# Patient Record
Sex: Female | Born: 1980 | Race: White | Hispanic: No | Marital: Married | Smoking: Never smoker
Health system: Southern US, Community
[De-identification: ages and names within clinical notes are randomized; demographics above are authoritative.]

## PROBLEM LIST (undated history)

## (undated) DIAGNOSIS — F5104 Psychophysiologic insomnia: Secondary | ICD-10-CM

## (undated) DIAGNOSIS — L309 Dermatitis, unspecified: Secondary | ICD-10-CM

## (undated) DIAGNOSIS — Z789 Other specified health status: Secondary | ICD-10-CM

## (undated) DIAGNOSIS — M25551 Pain in right hip: Secondary | ICD-10-CM

## (undated) DIAGNOSIS — F419 Anxiety disorder, unspecified: Secondary | ICD-10-CM

## (undated) HISTORY — PX: EYE SURGERY: SHX253

## (undated) HISTORY — DX: Other specified health status: Z78.9

## (undated) HISTORY — DX: Dermatitis, unspecified: L30.9

## (undated) HISTORY — DX: Psychophysiologic insomnia: F51.04

## (undated) HISTORY — PX: WISDOM TOOTH EXTRACTION: SHX21

## (undated) HISTORY — DX: Pain in right hip: M25.551

## (undated) HISTORY — DX: Anxiety disorder, unspecified: F41.9

---

## 2005-03-13 ENCOUNTER — Ambulatory Visit: Payer: Self-pay | Admitting: Obstetrics and Gynecology

## 2005-04-07 ENCOUNTER — Ambulatory Visit: Payer: Self-pay | Admitting: Obstetrics and Gynecology

## 2005-04-08 ENCOUNTER — Ambulatory Visit: Payer: Self-pay | Admitting: Obstetrics and Gynecology

## 2005-07-08 ENCOUNTER — Ambulatory Visit: Payer: Self-pay | Admitting: Obstetrics and Gynecology

## 2005-08-10 ENCOUNTER — Observation Stay: Payer: Self-pay | Admitting: Obstetrics and Gynecology

## 2005-08-13 ENCOUNTER — Inpatient Hospital Stay: Payer: Self-pay | Admitting: Obstetrics and Gynecology

## 2007-02-22 ENCOUNTER — Ambulatory Visit: Payer: Self-pay | Admitting: Obstetrics and Gynecology

## 2007-07-21 ENCOUNTER — Inpatient Hospital Stay: Payer: Self-pay | Admitting: Obstetrics and Gynecology

## 2008-02-09 ENCOUNTER — Ambulatory Visit: Payer: Self-pay | Admitting: Family Medicine

## 2009-08-19 ENCOUNTER — Encounter: Payer: Self-pay | Admitting: Orthopedic Surgery

## 2009-09-15 ENCOUNTER — Encounter: Payer: Self-pay | Admitting: Orthopedic Surgery

## 2009-10-10 ENCOUNTER — Ambulatory Visit: Payer: Self-pay | Admitting: Orthopedic Surgery

## 2009-10-16 ENCOUNTER — Encounter: Payer: Self-pay | Admitting: Orthopedic Surgery

## 2010-09-19 ENCOUNTER — Other Ambulatory Visit: Payer: Self-pay | Admitting: Family Medicine

## 2011-06-23 ENCOUNTER — Ambulatory Visit: Payer: Self-pay | Admitting: Obstetrics and Gynecology

## 2012-05-18 LAB — HM PAP SMEAR: HM Pap smear: NORMAL

## 2012-10-03 LAB — LIPID PANEL
CHOLESTEROL: 111 mg/dL (ref 0–200)
HDL: 44 mg/dL (ref 35–70)
LDL Cholesterol: 59 mg/dL
Triglycerides: 40 mg/dL (ref 40–160)

## 2014-08-08 ENCOUNTER — Ambulatory Visit: Payer: Self-pay | Admitting: Family Medicine

## 2014-08-21 ENCOUNTER — Other Ambulatory Visit (INDEPENDENT_AMBULATORY_CARE_PROVIDER_SITE_OTHER): Payer: 59

## 2014-08-21 ENCOUNTER — Encounter: Payer: Self-pay | Admitting: Family Medicine

## 2014-08-21 ENCOUNTER — Ambulatory Visit (INDEPENDENT_AMBULATORY_CARE_PROVIDER_SITE_OTHER): Payer: 59 | Admitting: Family Medicine

## 2014-08-21 VITALS — BP 110/78 | HR 79 | Ht 65.0 in | Wt 140.0 lb

## 2014-08-21 DIAGNOSIS — M25551 Pain in right hip: Secondary | ICD-10-CM

## 2014-08-21 DIAGNOSIS — M7601 Gluteal tendinitis, right hip: Secondary | ICD-10-CM | POA: Insufficient documentation

## 2014-08-21 NOTE — Patient Instructions (Addendum)
Good to see you.  Ice 20 minutes 2 times daily. Usually after activity and before bed. Exercises 3 times a week.  Try pennsaid topically up to 2 times daily.  Pinkie amount.  You are doing good overall just need to strengthen the glutes.  Wear good shoes always.  Consider Vitamin D 2000 IU daily See me again in3 weeks.

## 2014-08-21 NOTE — Assessment & Plan Note (Signed)
Patient's ultrasound finding is more consistent with a chronic gluteal tendinitis. Patient given home exercises, topical anti-inflammatories, and we discussed home exercises. Patient did work with the Hydrographic surveyor today that I think will be beneficial as well. Patient will try to make these changes and come back and see me again in 3 weeks. Patient did respond somewhat timid pronation as well. Also patient continues to have pain we'll consider x-rays to make sure that there is no impingement. I think this is low likelihood.

## 2014-08-21 NOTE — Progress Notes (Signed)
Pre visit review using our clinic review tool, if applicable. No additional management support is needed unless otherwise documented below in the visit note. 

## 2014-08-21 NOTE — Progress Notes (Signed)
[facility] [address] [place], [place] [address] Phone: [phone] Subjective:     CC:  Hip pain   [id number] Christine Ball is a 34 y.o. female coming in with complaint of hip pain. Patient has had right-sided pain for approximate 7 years. Patient notices it after she had her son. Patient does not remove or any true injury. Patient used to be an avid runner running proximally 5-6 times a week. Patient states though that unfortunately over the course last 4-5 year she was then and able to do it. Patient states that the pain on the lateral aspect of the hip seems to be getting worse. Patient states that it is waking her up at night. States that he can stop her from certain activities. Denies any radiation down the leg or any numbness or weakness. Patient rates the severity of pain as 5 out of 10 has not responded to over-the-counter medications. Patient is also been on Cymbalta with no significant improvement. Patient denies any history of back pain and denies any fever, chills, or any abnormal weight loss.     Past medical history, social, surgical and family history all reviewed in electronic medical record.   Review of Systems: No headache, visual changes, nausea, vomiting, diarrhea, constipation, dizziness, abdominal pain, skin rash, fevers, chills, night sweats, weight loss, swollen lymph nodes, body aches, joint swelling, muscle aches, chest pain, shortness of breath, mood changes.   Objective Blood pressure 110/78, pulse 79, height 5\' 5"  (1.651 m), weight 140 lb (63.504 kg), SpO2 99 %.  General: No apparent distress alert and oriented x3 mood and affect normal, dressed appropriately.  HEENT: Pupils equal, extraocular movements intact  Respiratory: Patient's speak in full sentences and does not appear short of breath  Cardiovascular: No lower extremity edema, non tender, no erythema  Skin: Warm dry intact with no signs of infection or rash on  extremities or on axial skeleton.  Abdomen: Soft nontender  Neuro: Cranial nerves II through XII are intact, neurovascularly intact in all extremities with 2+ DTRs and 2+ pulses.  Lymph: No lymphadenopathy of posterior or anterior cervical chain or axillae bilaterally.  Gait normal with good balance and coordination.  MSK:  Non tender with full range of motion and good stability and symmetric strength and tone of shoulders, elbows, wrist, , knee and ankles bilaterally.  Hip: Right ROM IR: 25 Deg, ER: 45 Deg, Flexion: 120 Deg, Extension: 100 Deg, Abduction: 45 Deg, Adduction: 45 Deg Strength IR: 5/5, ER: 5/5, Flexion: 5/5, Extension: 5/5, Abduction: 3/5 compared to 54-5 on the contralateral side., Adduction: 5/5 Pelvic alignment unremarkable to inspection and palpation. Standing hip rotation and gait without trendelenburg sign / unsteadiness. Greater trochanter with minimal tenderness but patient is very tender over the insertion of the gluteal area. without tenderness to palpation. I'll tenderness over the piriformis Mild positive Faber No SI joint tenderness and normal minimal SI movement. Lateral hip unremarkable.  MSK US performed of: Right hip This study was ordered, performed, and interpreted by Charlann Boxer D.O.  Hip: Trochanteric bursa without swelling or effusion. Acetabular labrum visualized and without tears, displacement, or effusion in joint. Femoral neck appears unremarkable without increased power doppler signal along Cortex. Patient though does have what appears to be significant thickening of the tendon of the gluteal medius tendon at the insertion over the greater trochanteric area. No significant tearing but increasing Doppler flow noted.  IMPRESSION:  Gluteal tendinitis chronic  Procedure note 60109; 15  minutes spent for Therapeutic exercises as stated in above notes.  This included exercises focusing on stretching, strengthening, with significant focus on eccentric  aspects.   Proper technique shown and discussed handout in great detail with ATC.  All questions were discussed and answered.     Impression and Recommendations:     This case required medical decision making of moderate complexity.

## 2014-09-13 ENCOUNTER — Encounter: Payer: Self-pay | Admitting: Family Medicine

## 2014-09-13 ENCOUNTER — Ambulatory Visit (INDEPENDENT_AMBULATORY_CARE_PROVIDER_SITE_OTHER): Payer: 59 | Admitting: Family Medicine

## 2014-09-13 ENCOUNTER — Ambulatory Visit (INDEPENDENT_AMBULATORY_CARE_PROVIDER_SITE_OTHER)
Admission: RE | Admit: 2014-09-13 | Discharge: 2014-09-13 | Disposition: A | Discharge status: Home / Self Care | Payer: 59 | Source: Ambulatory Visit | Attending: Family Medicine | Admitting: Family Medicine

## 2014-09-13 VITALS — BP 104/76 | HR 69 | Ht 65.0 in | Wt 140.0 lb

## 2014-09-13 DIAGNOSIS — M25551 Pain in right hip: Secondary | ICD-10-CM

## 2014-09-13 DIAGNOSIS — M7601 Gluteal tendinitis, right hip: Secondary | ICD-10-CM

## 2014-09-13 MED ORDER — NITROGLYCERIN 0.2 MG/HR TD PT24: MEDICATED_PATCH | TRANSDERMAL | Status: DC

## 2014-09-13 NOTE — Progress Notes (Signed)
Pre visit review using our clinic review tool, if applicable. No additional management support is needed unless otherwise documented below in the visit note. 

## 2014-09-13 NOTE — Progress Notes (Signed)
  [facility] [address] [place], [place] [address] Phone: [phone] Subjective:     CC:  Hip pain follow-up   [id number] Christine Ball is a 34 y.o. female coming in with complaint of hip pain. Patient has had right-sided pain for approximate 7 years. Patient had failed conservative therapy including formal physical therapy back in 2011 and 2014. Patient was found to have what appeared to be more of a chronic gluteal tendinitis. Patient given home exercises, icing protocol, topical anti-inflammatories and discussed over-the-counter natural supplementations. Patient states overall she has made very minimal improvement. Patient has not started running yet. Patient states that it is not affecting her daily activities may be as much but when she does try to increase her activity the pain is still there. Patient has tried given the over-the-counter natural supplementations and is doing the exercises the patient has not been doing the icing. Maybe not as much pain at night.    Past medical history, social, surgical and family history all reviewed in electronic medical record.   Review of Systems: No headache, visual changes, nausea, vomiting, diarrhea, constipation, dizziness, abdominal pain, skin rash, fevers, chills, night sweats, weight loss, swollen lymph nodes, body aches, joint swelling, muscle aches, chest pain, shortness of breath, mood changes.   Objective Blood pressure 104/76, pulse 69, height 5\' 5"  (1.651 m), weight 140 lb (63.504 kg), last menstrual period 08/23/2014, SpO2 99 %.  General: No apparent distress alert and oriented x3 mood and affect normal, dressed appropriately.  HEENT: Pupils equal, extraocular movements intact  Respiratory: Patient's speak in full sentences and does not appear short of breath  Cardiovascular: No lower extremity edema, non tender, no erythema  Skin: Warm dry intact with no signs of infection or rash on  extremities or on axial skeleton.  Abdomen: Soft nontender  Neuro: Cranial nerves II through XII are intact, neurovascularly intact in all extremities with 2+ DTRs and 2+ pulses.  Lymph: No lymphadenopathy of posterior or anterior cervical chain or axillae bilaterally.  Gait normal with good balance and coordination.  MSK:  Non tender with full range of motion and good stability and symmetric strength and tone of shoulders, elbows, wrist, , knee and ankles bilaterally.  Hip: Right ROM IR: 15 Deg, ER: 45 Deg, Flexion: 120 Deg, Extension: 100 Deg, Abduction: 45 Deg, Adduction: 45 Deg Strength IR: 5/5, ER: 5/5, Flexion: 5/5, Extension: 5/5, Abduction: 3/5 compared to 4-5 on the contralateral side., Adduction: 5/5 Pelvic alignment unremarkable to inspection and palpation. Standing hip rotation and gait without trendelenburg sign / unsteadiness. Greater trochanter with minimal tenderness but patient is very tender over the insertion of the gluteal area still present Mild tenderness over the piriformis Mild positive Faber No SI joint tenderness and normal minimal SI movement. Contralateral hip unremarkable. No significant change from previous exam.    Impression and Recommendations:     This case required medical decision making of moderate complexity.

## 2014-09-13 NOTE — Assessment & Plan Note (Signed)
Patient does not respond significantly to conservative therapy. Patient will be started on nitroglycerin patches and we will get an x-ray to rule out any bony deformity. Patient did have a history of an MRI back in 2011 without any significant findings. Patient additionally this patient was given different exercises today. Patient will try to make these changes and not doing well we'll consider injection. Patient will follow-up with me again in 3-4 weeks.

## 2014-09-13 NOTE — Patient Instructions (Addendum)
Good to see you Conitnue the exercises ICe is your friend Nitroglycerin Protocol   Apply 1/4 nitroglycerin patch to affected area daily.  Change position of patch within the affected area every 24 hours.  You may experience a headache during the first 1-2 weeks of using the patch, these should subside.  If you experience headaches after beginning nitroglycerin patch treatment, you may take your preferred over the counter pain reliever.  Another side effect of the nitroglycerin patch is skin irritation or rash related to patch adhesive.  Please notify our office if you develop more severe headaches or rash, and stop the patch.  Tendon healing with nitroglycerin patch may require 12 to 24 weeks depending on the extent of injury.  Men should not use if taking Viagra, Cialis, or Levitra.   Do not use if you have migraines or rosacea.   Continue vitamin D as well.  Turmeric 500mg  twice daily See me again in 3-4 weeks.

## 2014-10-08 ENCOUNTER — Encounter: Payer: Self-pay | Admitting: Family Medicine

## 2014-10-08 ENCOUNTER — Ambulatory Visit (INDEPENDENT_AMBULATORY_CARE_PROVIDER_SITE_OTHER): Payer: 59 | Admitting: Family Medicine

## 2014-10-08 VITALS — BP 116/70 | HR 78 | Wt 141.0 lb

## 2014-10-08 DIAGNOSIS — M999 Biomechanical lesion, unspecified: Secondary | ICD-10-CM | POA: Insufficient documentation

## 2014-10-08 DIAGNOSIS — M9903 Segmental and somatic dysfunction of lumbar region: Secondary | ICD-10-CM

## 2014-10-08 DIAGNOSIS — M9905 Segmental and somatic dysfunction of pelvic region: Secondary | ICD-10-CM | POA: Diagnosis not present

## 2014-10-08 DIAGNOSIS — M9904 Segmental and somatic dysfunction of sacral region: Secondary | ICD-10-CM | POA: Diagnosis not present

## 2014-10-08 DIAGNOSIS — M7601 Gluteal tendinitis, right hip: Secondary | ICD-10-CM | POA: Diagnosis not present

## 2014-10-08 NOTE — Progress Notes (Signed)
  [facility] [place] [place], [place] [address] Phone: [phone] Subjective:     CC:  Hip pain follow-up   [id number] Christine Ball is a 34 y.o. female coming in with complaint of hip pain. Patient has had right-sided pain for approximate 7 years. Patient had failed conservative therapy including formal physical therapy back in 2011 and 2014. Patient was found to have what appeared to be more of a chronic gluteal tendinitis. Patient given home exercises, icing protocol, topical anti-inflammatories and discussed over-the-counter natural supplementations. Patient did not make significant improvement and was started on the nitroglycerin patches. Patient had x-rays which were unremarkable of her hip. Patient states she has made minimal improvement. Patient states no worsening though. Patient has been running 3 times a week but still has the discomfort on the right lateral posterior aspect of the hip. Denies any radiation down the leg or any numbness or tingling. She continues with the natural supplementation. Patient is doing the nitroglycerin patches and states very minimal side effects. Patient is doing the exercises regularly.    Past medical history, social, surgical and family history all reviewed in electronic medical record.   Review of Systems: No headache, visual changes, nausea, vomiting, diarrhea, constipation, dizziness, abdominal pain, skin rash, fevers, chills, night sweats, weight loss, swollen lymph nodes, body aches, joint swelling, muscle aches, chest pain, shortness of breath, mood changes.   Objective Last menstrual period 08/23/2014.  General: No apparent distress alert and oriented x3 mood and affect normal, dressed appropriately.  HEENT: Pupils equal, extraocular movements intact  Respiratory: Patient's speak in full sentences and does not appear short of breath  Cardiovascular: No lower extremity edema, non tender, no erythema    Skin: Warm dry intact with no signs of infection or rash on extremities or on axial skeleton.  Abdomen: Soft nontender  Neuro: Cranial nerves II through XII are intact, neurovascularly intact in all extremities with 2+ DTRs and 2+ pulses.  Lymph: No lymphadenopathy of posterior or anterior cervical chain or axillae bilaterally.  Gait normal with good balance and coordination.  MSK:  Non tender with full range of motion and good stability and symmetric strength and tone of shoulders, elbows, wrist, , knee and ankles bilaterally.  Hip: Right ROM IR: 15 Deg, ER: 45 Deg, Flexion: 120 Deg, Extension: 100 Deg, Abduction: 45 Deg, Adduction: 45 Deg Strength IR: 5/5, ER: 5/5, Flexion: 5/5, Extension: 5/5, Abduction: 3/5 compared to 4-5 on the contralateral side., Adduction: 5/5 Pelvic alignment unremarkable to inspection and palpation. Standing hip rotation and gait without trendelenburg sign / unsteadiness. Greater trochanter with minimal tenderness but patient is very tender over the insertion of the gluteal area still present Mild tenderness over the piriformis Mild positive Faber No SI joint tenderness and normal minimal SI movement. Contralateral hip unremarkable. No significant change from previous exam.   Osteopathic findings  Lumbar L2 flexed rotated and side bent right Sacrum Left on left Ileum Right posterior   Impression and Recommendations:     This case required medical decision making of moderate complexity.

## 2014-10-08 NOTE — Progress Notes (Signed)
Pre visit review using our clinic review tool, if applicable. No additional management support is needed unless otherwise documented below in the visit note. 

## 2014-10-08 NOTE — Assessment & Plan Note (Signed)
Patient continues to have some discomfort. We discussed the icing protocol and continuing the nitroglycerin. We discussed the possibility of formal physical therapy but patient has done this twice. We discussed different changes including orthotics that could be beneficial. We discussed osteopathic manipulation with patient try today with some mild to moderate benefit. Patient then will attempt this conservative measures and come back again in 3 weeks. At that time we'll consider repeating osteopathic manipulation as well as consider an injection into the gluteal tendon if necessary. Patient agrees with plan.

## 2014-10-08 NOTE — Assessment & Plan Note (Signed)
Decision today to treat with OMT was based on Physical Exam  After verbal consent patient was treated with HVLA, ME techniques in lumbar sacral and ileum areas  Patient tolerated the procedure well with improvement in symptoms  Patient given exercises, stretches and lifestyle modifications  See medications in patient instructions if given  Patient will follow up in 3 weeks

## 2014-10-08 NOTE — Patient Instructions (Addendum)
Great to see you Spenco orthotic look online, "tptal support" Continue the exercises and focus on hip abductors.  Look for TENS unit online Ice still after activity Conitnue the nitro In 3 weeks see me again and if not better I get to stick a needle in you

## 2014-10-29 ENCOUNTER — Ambulatory Visit: Payer: 59 | Admitting: Family Medicine

## 2014-11-21 ENCOUNTER — Ambulatory Visit: Payer: 59 | Admitting: Family Medicine

## 2015-03-14 ENCOUNTER — Encounter: Payer: Self-pay | Admitting: Family Medicine

## 2015-03-14 ENCOUNTER — Ambulatory Visit (INDEPENDENT_AMBULATORY_CARE_PROVIDER_SITE_OTHER): Payer: 59 | Admitting: Family Medicine

## 2015-03-14 VITALS — HR 112 | Temp 98.4°F | Resp 14 | Ht 65.0 in | Wt 138.0 lb

## 2015-03-14 DIAGNOSIS — Z23 Encounter for immunization: Secondary | ICD-10-CM

## 2015-03-14 DIAGNOSIS — L309 Dermatitis, unspecified: Secondary | ICD-10-CM | POA: Insufficient documentation

## 2015-03-14 DIAGNOSIS — Z13220 Encounter for screening for lipoid disorders: Secondary | ICD-10-CM | POA: Diagnosis not present

## 2015-03-14 DIAGNOSIS — Z79899 Other long term (current) drug therapy: Secondary | ICD-10-CM | POA: Diagnosis not present

## 2015-03-14 DIAGNOSIS — F411 Generalized anxiety disorder: Secondary | ICD-10-CM | POA: Diagnosis not present

## 2015-03-14 DIAGNOSIS — G4700 Insomnia, unspecified: Secondary | ICD-10-CM

## 2015-03-14 DIAGNOSIS — Z789 Other specified health status: Secondary | ICD-10-CM | POA: Insufficient documentation

## 2015-03-14 MED ORDER — DULOXETINE HCL 30 MG PO CPEP: 30.0000 mg | ORAL_CAPSULE | Freq: Every day | ORAL | Status: DC

## 2015-03-14 MED ORDER — ALPRAZOLAM 0.5 MG PO TABS: 0.5000 mg | ORAL_TABLET | Freq: Every evening | ORAL | Status: DC | PRN

## 2015-03-14 NOTE — Progress Notes (Signed)
Name: Christine Ball   MRN: [id number]    DOB: 01-Jun-1980   Date:03/14/2015       Progress Note  Subjective  Chief Complaint  Chief Complaint  Patient presents with  . Medication Refill    follow-up  . Anxiety    HPI  GAD: she states she does not feel sad, she has problems sleeping, feels edgy and short tempered.  She has been taking Duloxetine for about 6 years, and has sexual dysfunction when she takes medication daily, but symptoms are controlled with every other day medication.  No withdrawals without medication.   Vegetarian: she is strict vegetarian, she states she feels fatigue  Insomnia: she takes alprazolam prn, she sleeps well on medication, but has to take it with Tylenol PM, wakes up sometimes feeling a little sleepy otherwise no side effects   Patient Active Problem List   Diagnosis Date Noted  . Insomnia, persistent 03/14/2015  . GAD (generalized anxiety disorder) 03/14/2015  . Dermatitis, eczematoid 03/14/2015  . Strict vegetarian diet 03/14/2015  . Nonallopathic lesion of sacral region 10/08/2014  . Nonallopathic lesion of pelvic region 10/08/2014  . Nonallopathic lesion of lumbosacral region 10/08/2014  . Gluteal tendinitis of right buttock 08/21/2014    History reviewed. No pertinent past surgical history.  Family History  Problem Relation Age of Onset  . Osteoporosis Mother   . Metabolic syndrome Father     Social History   Social History  . Marital Status: Married    Spouse Name: N/A  . Number of Children: N/A  . Years of Education: N/A   Occupational History  . Not on file.   Social History Main Topics  . Smoking status: Never Smoker   . Smokeless tobacco: Never Used  . Alcohol Use: 0.0 oz/week    0 Standard drinks or equivalent per week  . Drug Use: No  . Sexual Activity: Yes   Other Topics Concern  . Not on file   Social History Narrative     Current outpatient prescriptions:  .  cholecalciferol (VITAMIN D) 1000 UNITS  tablet, Take 1,000 Units by mouth daily., Disp: , Rfl:  .  DULoxetine (CYMBALTA) 30 MG capsule, Take 1 capsule (30 mg total) by mouth daily., Disp: 90 capsule, Rfl: 1 .  Omega-3 Fatty Acids (FISH OIL) 600 MG CAPS, Take 600 m by mouth once., Disp: , Rfl:  .  ALPRAZolam (XANAX) 0.5 MG tablet, Take 1 tablet (0.5 mg total) by mouth at bedtime as needed for anxiety., Disp: 30 tablet, Rfl: 2 .  Multiple Vitamins-Minerals (MULTI VITAMIN/MINERALS) TABS, Take 1 tablet by mouth daily., Disp: , Rfl:   Allergies  Allergen Reactions  . Penicillins      ROS  Constitutional: Negative for fever or weight change.  Respiratory: Negative for cough and shortness of breath.   Cardiovascular: Negative for chest pain or palpitations.  Gastrointestinal: Negative for abdominal pain, no bowel changes.  Musculoskeletal: Negative for gait problem or joint swelling.  Skin: Negative for rash.  Neurological: Negative for dizziness or headache.  No other specific complaints in a complete review of systems (except as listed in HPI above).  Objective  Filed Vitals:   03/14/15 0924  Pulse: 112  Temp: 98.4 F (36.9 C)  TempSrc: Oral  Resp: 14  Height: 5\' 5"  (1.651 m)  Weight: 138 lb (62.596 kg)  SpO2: 97%    Body mass index is 22.96 kg/(m^2).  Physical Exam  Constitutional: Patient appears well-developed and well-nourished.  No distress.  HEENT: head atraumatic, normocephalic, pupils equal and reactive to light,  neck supple, throat within normal limits Cardiovascular: Normal rate, regular rhythm and normal heart sounds.  No murmur heard. No BLE edema. Pulmonary/Chest: Effort normal and breath sounds normal. No respiratory distress. Abdominal: Soft.  There is no tenderness. Psychiatric: Patient has a normal mood and affect. behavior is normal. Judgment and thought content normal. Muscular Skeletal: normal rom of both hips, mild pain during palpation over left trochanteric bursa  PHQ2/9: Depression  screen [facility] 2/9 03/14/2015  Decreased Interest 0  Down, Depressed, Hopeless 0  PHQ - 2 Score 0    Fall Risk: Fall Risk  03/14/2015  Falls in the past year? No    Functional Status Survey: Is the patient deaf or have difficulty hearing?: No Does the patient have difficulty seeing, even when wearing glasses/contacts?: Yes (glasses) Does the patient have difficulty concentrating, remembering, or making decisions?: No Does the patient have difficulty walking or climbing stairs?: No Does the patient have difficulty dressing or bathing?: No Does the patient have difficulty doing errands alone such as visiting a doctor's office or shopping?: No    Assessment & Plan  1. Insomnia, persistent  Continue prn medication  - ALPRAZolam (XANAX) 0.5 MG tablet; Take 1 tablet (0.5 mg total) by mouth at bedtime as needed for anxiety.  Dispense: 30 tablet; Refill: 2  2. Needs flu shot  - Flu Vaccine QUAD 36+ mos PF IM (Fluarix & Fluzone Quad PF)  3. GAD (generalized anxiety disorder)  - Thyroid Panel With TSH - ALPRAZolam (XANAX) 0.5 MG tablet; Take 1 tablet (0.5 mg total) by mouth at bedtime as needed for anxiety.  Dispense: 30 tablet; Refill: 2 - DULoxetine (CYMBALTA) 30 MG capsule; Take 1 capsule (30 mg total) by mouth daily.  Dispense: 90 capsule; Refill: 1  4. Strict vegetarian diet  - CBC with Differential/Platelet - Vit D  25 hydroxy (rtn osteoporosis monitoring) - Vitamin B12  5. Long-term use of high-risk medication  - Comprehensive metabolic panel  6. Lipid screening  - Lipid panel

## 2015-03-20 LAB — CBC WITH DIFFERENTIAL/PLATELET
BASOS ABS: 0 10*3/uL (ref 0.0–0.2)
BASOS: 0 %
EOS (ABSOLUTE): 0.1 10*3/uL (ref 0.0–0.4)
Eos: 2 %
HEMOGLOBIN: 14.5 g/dL (ref 11.1–15.9)
Hematocrit: 41.6 % (ref 34.0–46.6)
IMMATURE GRANS (ABS): 0 10*3/uL (ref 0.0–0.1)
Immature Granulocytes: 0 %
LYMPHS: 37 %
Lymphocytes Absolute: 1.9 10*3/uL (ref 0.7–3.1)
MCH: 31 pg (ref 26.6–33.0)
MCHC: 34.9 g/dL (ref 31.5–35.7)
MCV: 89 fL (ref 79–97)
MONOCYTES: 8 %
Monocytes Absolute: 0.4 10*3/uL (ref 0.1–0.9)
NEUTROS ABS: 2.8 10*3/uL (ref 1.4–7.0)
Neutrophils: 53 %
Platelets: 294 10*3/uL (ref 150–379)
RBC: 4.67 x10E6/uL (ref 3.77–5.28)
RDW: 12.9 % (ref 12.3–15.4)
WBC: 5.3 10*3/uL (ref 3.4–10.8)

## 2015-03-21 LAB — COMPREHENSIVE METABOLIC PANEL
ALBUMIN: 4.5 g/dL (ref 3.5–5.5)
ALK PHOS: 55 IU/L (ref 39–117)
ALT: 15 IU/L (ref 0–32)
AST: 20 IU/L (ref 0–40)
Albumin/Globulin Ratio: 1.9 (ref 1.1–2.5)
BILIRUBIN TOTAL: 0.5 mg/dL (ref 0.0–1.2)
BUN / CREAT RATIO: 16 (ref 8–20)
BUN: 13 mg/dL (ref 6–20)
CHLORIDE: 100 mmol/L (ref 97–106)
CO2: 23 mmol/L (ref 18–29)
Calcium: 9.4 mg/dL (ref 8.7–10.2)
Creatinine, Ser: 0.81 mg/dL (ref 0.57–1.00)
GFR calc Af Amer: 109 mL/min/{1.73_m2} (ref >59)
GFR calc non Af Amer: 94 mL/min/{1.73_m2} (ref >59)
GLOBULIN, TOTAL: 2.4 g/dL (ref 1.5–4.5)
Glucose: 87 mg/dL (ref 65–99)
POTASSIUM: 4.5 mmol/L (ref 3.5–5.2)
SODIUM: 140 mmol/L (ref 136–144)
Total Protein: 6.9 g/dL (ref 6.0–8.5)

## 2015-03-21 LAB — THYROID PANEL WITH TSH
Free Thyroxine Index: 2.5 (ref 1.2–4.9)
T3 Uptake Ratio: 30 % (ref 24–39)
T4, Total: 8.2 ug/dL (ref 4.5–12.0)
TSH: 2.75 u[IU]/mL (ref 0.450–4.500)

## 2015-03-21 LAB — VITAMIN D 25 HYDROXY (VIT D DEFICIENCY, FRACTURES): Vit D, 25-Hydroxy: 57.7 ng/mL (ref 30.0–100.0)

## 2015-03-21 LAB — LIPID PANEL
CHOL/HDL RATIO: 3.4 ratio (ref 0.0–4.4)
Cholesterol, Total: 161 mg/dL (ref 100–199)
HDL: 48 mg/dL (ref >39)
LDL CALC: 95 mg/dL (ref 0–99)
TRIGLYCERIDES: 91 mg/dL (ref 0–149)
VLDL CHOLESTEROL CAL: 18 mg/dL (ref 5–40)

## 2015-03-21 LAB — VITAMIN B12: Vitamin B-12: 770 pg/mL (ref 211–946)

## 2015-05-24 DIAGNOSIS — H5213 Myopia, bilateral: Secondary | ICD-10-CM | POA: Diagnosis not present

## 2015-07-23 ENCOUNTER — Telehealth: Payer: Self-pay | Admitting: Family Medicine

## 2015-07-23 NOTE — Telephone Encounter (Signed)
Husband was diagnosed with the flu today and would like to know if you would give her a prescription for tamiflu. Please send to [place]

## 2015-08-13 ENCOUNTER — Other Ambulatory Visit: Payer: Self-pay | Admitting: Family Medicine

## 2015-09-16 LAB — HM PAP SMEAR: HM Pap smear: NORMAL

## 2015-10-23 DIAGNOSIS — D485 Neoplasm of uncertain behavior of skin: Secondary | ICD-10-CM | POA: Diagnosis not present

## 2015-10-23 DIAGNOSIS — D1801 Hemangioma of skin and subcutaneous tissue: Secondary | ICD-10-CM | POA: Diagnosis not present

## 2015-10-23 DIAGNOSIS — Z872 Personal history of diseases of the skin and subcutaneous tissue: Secondary | ICD-10-CM | POA: Diagnosis not present

## 2015-10-23 DIAGNOSIS — Z1283 Encounter for screening for malignant neoplasm of skin: Secondary | ICD-10-CM | POA: Diagnosis not present

## 2015-11-05 DIAGNOSIS — D485 Neoplasm of uncertain behavior of skin: Secondary | ICD-10-CM | POA: Diagnosis not present

## 2015-11-05 DIAGNOSIS — D224 Melanocytic nevi of scalp and neck: Secondary | ICD-10-CM | POA: Diagnosis not present

## 2015-12-30 ENCOUNTER — Ambulatory Visit (INDEPENDENT_AMBULATORY_CARE_PROVIDER_SITE_OTHER): Payer: 59 | Admitting: Family Medicine

## 2015-12-30 ENCOUNTER — Encounter: Payer: Self-pay | Admitting: Family Medicine

## 2015-12-30 ENCOUNTER — Other Ambulatory Visit: Payer: Self-pay | Admitting: Family Medicine

## 2015-12-30 VITALS — BP 110/84 | HR 89 | Temp 98.2°F | Resp 16 | Ht 65.0 in | Wt 141.3 lb

## 2015-12-30 DIAGNOSIS — F411 Generalized anxiety disorder: Secondary | ICD-10-CM

## 2015-12-30 DIAGNOSIS — G4700 Insomnia, unspecified: Secondary | ICD-10-CM | POA: Diagnosis not present

## 2015-12-30 MED ORDER — DULOXETINE HCL 30 MG PO CPEP: 30.0000 mg | ORAL_CAPSULE | Freq: Every day | ORAL | 1 refills | Status: DC

## 2015-12-30 MED ORDER — ALPRAZOLAM 0.5 MG PO TABS: 0.5000 mg | ORAL_TABLET | Freq: Every evening | ORAL | 0 refills | Status: DC | PRN

## 2015-12-30 NOTE — Telephone Encounter (Signed)
PT CONING IN THIS AFTERNOON AT 3:40

## 2015-12-30 NOTE — Progress Notes (Signed)
Name: Christine Ball   MRN: [id number]    DOB: Oct 11, 1980   Date:12/30/2015       Progress Note  Subjective  Chief Complaint  Chief Complaint  Patient presents with  . Medication Refill  . Insomnia    Uses Alprazolam as needed, since the kids are going back to school and getting a new puppy. Patient is requesting a refill of medication. Sleeps 6-8 hours on average  . Anxiety    Stable    HPI  GAD: she states she does not feel sad, she has problems sleeping, feels edgy and short tempered, however she is feeling much better on Duloxetine.  She can only tolerate 30 mg to avoid sexual problems but is now only taking every other day and still works well for her.   Vegetarian: she is strict vegetarian, feels good, last labs within normal limits  Insomnia: she takes alprazolam prn, she sleeps well on medication, but has to take it with Tylenol PM, wakes up sometimes feeling a little sleepy otherwise no side effects.    Patient Active Problem List   Diagnosis Date Noted  . Insomnia, persistent 03/14/2015  . GAD (generalized anxiety disorder) 03/14/2015  . Dermatitis, eczematoid 03/14/2015  . Strict vegetarian diet 03/14/2015  . Nonallopathic lesion of sacral region 10/08/2014  . Nonallopathic lesion of pelvic region 10/08/2014  . Nonallopathic lesion of lumbosacral region 10/08/2014  . Gluteal tendinitis of right buttock 08/21/2014    History reviewed. No pertinent surgical history.  Family History  Problem Relation Age of Onset  . Osteoporosis Mother   . Metabolic syndrome Father     Social History   Social History  . Marital status: Married    Spouse name: N/A  . Number of children: N/A  . Years of education: N/A   Occupational History  . Not on file.   Social History Main Topics  . Smoking status: Never Smoker  . Smokeless tobacco: Never Used  . Alcohol use 0.0 oz/week  . Drug use: No  . Sexual activity: Yes    Partners: Male   Other Topics Concern  .  Not on file   Social History Narrative  . No narrative on file     Current Outpatient Prescriptions:  .  ALPRAZolam (XANAX) 0.5 MG tablet, Take 1 tablet (0.5 mg total) by mouth at bedtime as needed for anxiety., Disp: 30 tablet, Rfl: 2 .  cholecalciferol (VITAMIN D) 1000 UNITS tablet, Take 1,000 Units by mouth daily., Disp: , Rfl:  .  DULoxetine (CYMBALTA) 30 MG capsule, TAKE 1 CAPSULE BY MOUTH DAILY, Disp: 90 capsule, Rfl: 0 .  Multiple Vitamins-Minerals (MULTI VITAMIN/MINERALS) TABS, Take 1 tablet by mouth daily., Disp: , Rfl:  .  Omega-3 Fatty Acids (FISH OIL) 600 MG CAPS, Take 74 m by mouth once., Disp: , Rfl:   Allergies  Allergen Reactions  . Penicillins      ROS  Ten systems reviewed and is negative except as mentioned in HPI   Objective  Vitals:   12/30/15 1614  BP: 110/84  Pulse: 89  Resp: 16  Temp: 98.2 F (36.8 C)  TempSrc: Oral  SpO2: 97%  Weight: 141 lb 4.8 oz (64.1 kg)  Height: 5\' 5"  (1.651 m)    Body mass index is 23.51 kg/m.  Physical Exam  Constitutional: Patient appears well-developed and well-nourished.  No distress.  HEENT: head atraumatic, normocephalic, pupils equal and reactive to light, neck supple, throat within normal limits Cardiovascular: Normal rate, regular rhythm  and normal heart sounds.  No murmur heard. No BLE edema. Pulmonary/Chest: Effort normal and breath sounds normal. No respiratory distress. Abdominal: Soft.  There is no tenderness. Psychiatric: Patient has a normal mood and affect. behavior is normal. Judgment and thought content normal.  PHQ2/9: Depression screen [facility] 2/9 12/30/2015 03/14/2015  Decreased Interest 0 0  Down, Depressed, Hopeless 0 0  PHQ - 2 Score 0 0     Fall Risk: Fall Risk  12/30/2015 03/14/2015  Falls in the past year? No No     Functional Status Survey: Is the patient deaf or have difficulty hearing?: No Does the patient have difficulty seeing, even when wearing glasses/contacts?: No Does  the patient have difficulty concentrating, remembering, or making decisions?: No Does the patient have difficulty walking or climbing stairs?: No Does the patient have difficulty dressing or bathing?: No Does the patient have difficulty doing errands alone such as visiting a doctor's office or shopping?: No    Assessment & Plan  1. Insomnia, persistent  - ALPRAZolam (XANAX) 0.5 MG tablet; Take 1 tablet (0.5 mg total) by mouth at bedtime as needed for anxiety.  Dispense: 30 tablet; Refill: 0  2. GAD (generalized anxiety disorder)  - DULoxetine (CYMBALTA) 30 MG capsule; Take 1 capsule (30 mg total) by mouth daily.  Dispense: 90 capsule; Refill: 1 - ALPRAZolam (XANAX) 0.5 MG tablet; Take 1 tablet (0.5 mg total) by mouth at bedtime as needed for anxiety.  Dispense: 30 tablet; Refill: 0

## 2016-02-24 ENCOUNTER — Ambulatory Visit (INDEPENDENT_AMBULATORY_CARE_PROVIDER_SITE_OTHER): Payer: 59

## 2016-02-24 DIAGNOSIS — Z23 Encounter for immunization: Secondary | ICD-10-CM | POA: Diagnosis not present

## 2016-05-20 DIAGNOSIS — D229 Melanocytic nevi, unspecified: Secondary | ICD-10-CM | POA: Diagnosis not present

## 2016-05-21 ENCOUNTER — Encounter: Payer: Self-pay | Admitting: Family Medicine

## 2016-05-21 ENCOUNTER — Ambulatory Visit (INDEPENDENT_AMBULATORY_CARE_PROVIDER_SITE_OTHER): Payer: 59 | Admitting: Family Medicine

## 2016-05-21 VITALS — BP 108/62 | HR 110 | Temp 98.7°F | Resp 18 | Ht 66.0 in | Wt 145.1 lb

## 2016-05-21 DIAGNOSIS — K648 Other hemorrhoids: Secondary | ICD-10-CM | POA: Diagnosis not present

## 2016-05-21 DIAGNOSIS — K625 Hemorrhage of anus and rectum: Secondary | ICD-10-CM

## 2016-05-21 DIAGNOSIS — K5900 Constipation, unspecified: Secondary | ICD-10-CM | POA: Diagnosis not present

## 2016-05-21 LAB — POC HEMOCCULT BLD/STL (OFFICE/1-CARD/DIAGNOSTIC): Fecal Occult Blood, POC: NEGATIVE

## 2016-05-21 MED ORDER — POLYETHYLENE GLYCOL 3350 17 GM/SCOOP PO POWD: 17.0000 g | Freq: Two times a day (BID) | ORAL | 1 refills | Status: DC | PRN

## 2016-05-21 MED ORDER — HYDROCORTISONE ACETATE 25 MG RE SUPP: 25.0000 mg | Freq: Two times a day (BID) | RECTAL | 0 refills | Status: DC

## 2016-05-21 NOTE — Progress Notes (Signed)
Name: Christine Ball   MRN: [id number]    DOB: 1981/04/12   Date:05/21/2016       Progress Note  Subjective  Chief Complaint  Chief Complaint  Patient presents with  . Rectal Problems    Patient states since Christmas she has had really hard bowel movements and has had swelling that now has radiated to her vaginal area. Patient has tried Yeast medication and Ring worm medication with no relief.     HPI  Hemorrhoids: she went out on vacation the week before Christmas and had a couple very hard bowel movements the week prior to Christmas, she saw blood on toilette bowl, and on toilette paper, she states she tried otc preparation H without help. Symptoms are worse at night, with itching and burning sensation on her anal area, she had an episode of vaginal swelling but yeast and ring worm test were negative. She had symptoms hemorrhoids in the past, with bleeding and improved with otc , but this time is not going away  Patient Active Problem List   Diagnosis Date Noted  . Insomnia, persistent 03/14/2015  . GAD (generalized anxiety disorder) 03/14/2015  . Dermatitis, eczematoid 03/14/2015  . Strict vegetarian diet 03/14/2015  . Nonallopathic lesion of sacral region 10/08/2014  . Nonallopathic lesion of lumbosacral region 10/08/2014  . Gluteal tendinitis of right buttock 08/21/2014    History reviewed. No pertinent surgical history.  Family History  Problem Relation Age of Onset  . Osteoporosis Mother   . Metabolic syndrome Father     Social History   Social History  . Marital status: Married    Spouse name: N/A  . Number of children: N/A  . Years of education: N/A   Occupational History  . Not on file.   Social History Main Topics  . Smoking status: Never Smoker  . Smokeless tobacco: Never Used  . Alcohol use 0.0 oz/week  . Drug use: No  . Sexual activity: Yes    Partners: Male   Other Topics Concern  . Not on file   Social History Narrative  . No narrative on  file     Current Outpatient Prescriptions:  .  ALPRAZolam (XANAX) 0.5 MG tablet, Take 1 tablet (0.5 mg total) by mouth at bedtime as needed for anxiety., Disp: 30 tablet, Rfl: 0 .  cholecalciferol (VITAMIN D) 1000 UNITS tablet, Take 1,000 Units by mouth daily., Disp: , Rfl:  .  Multiple Vitamins-Minerals (MULTI VITAMIN/MINERALS) TABS, Take 1 tablet by mouth daily., Disp: , Rfl:  .  Omega-3 Fatty Acids (FISH OIL) 600 MG CAPS, Take 86 m by mouth once., Disp: , Rfl:   Allergies  Allergen Reactions  . Penicillins      ROS  Constitutional: Negative for fever or weight change.  Respiratory: Negative for cough and shortness of breath.   Cardiovascular: Negative for chest pain or palpitations.  Gastrointestinal: Negative for abdominal pain, no bowel changes.  Musculoskeletal: Negative for gait problem or joint swelling.  Skin: Negative for rash.  Neurological: Negative for dizziness or headache.  No other specific complaints in a complete review of systems (except as listed in HPI above).  Objective  Vitals:   05/21/16 1120  BP: 108/62  Pulse: (!) 110  Resp: 18  Temp: 98.7 F (37.1 C)  TempSrc: Oral  SpO2: 98%  Weight: 145 lb 2 oz (65.8 kg)  Height: 5\' 6"  (1.676 m)    Body mass index is 23.42 kg/m.  Physical Exam  Constitutional: Patient appears  well-developed and well-nourished.  No distress.  HEENT: head atraumatic, normocephalic, pupils equal and reactive to light, neck supple, throat within normal limits Cardiovascular: Normal rate, regular rhythm and normal heart sounds.  No murmur heard. No BLE edema. Pulmonary/Chest: Effort normal and breath sounds normal. No respiratory distress. Rectal exam: normal external exam, possible internal hemorrhoid at 6 o'clock position, no fissures or blood, no stools on vault, vaginal introitus normal without irritation , normal rectal tonus Abdominal: Soft.  There is no tenderness. Psychiatric: Patient has a normal mood and affect.  behavior is normal. Judgment and thought content normal.   PHQ2/9: Depression screen [facility] 2/9 05/21/2016 12/30/2015 03/14/2015  Decreased Interest 0 0 0  Down, Depressed, Hopeless 0 0 0  PHQ - 2 Score 0 0 0     Fall Risk: Fall Risk  05/21/2016 12/30/2015 03/14/2015  Falls in the past year? No No No     Functional Status Survey: Is the patient deaf or have difficulty hearing?: No Does the patient have difficulty seeing, even when wearing glasses/contacts?: No Does the patient have difficulty concentrating, remembering, or making decisions?: No Does the patient have difficulty walking or climbing stairs?: No Does the patient have difficulty dressing or bathing?: No Does the patient have difficulty doing errands alone such as visiting a doctor's office or shopping?: No   Assessment & Plan  1. Internal hemorrhoid  - POC Hemoccult Bld/Stl (1-Cd Office Dx) - hydrocortisone (ANUSOL-HC) 25 MG suppository; Place 1 suppository (25 mg total) rectally 2 (two) times daily.  Dispense: 12 suppository; Refill: 0  2. Rectal bleeding  - POC Hemoccult Bld/Stl (1-Cd Office Dx) - POC Hemoccult Bld/Stl (3-Cd Home Screen); Future  3. Acute constipation  - polyethylene glycol powder (GLYCOLAX/MIRALAX) powder; Take 17 g by mouth 2 (two) times daily as needed.  Dispense: 3350 g; Refill: 1

## 2016-05-22 ENCOUNTER — Ambulatory Visit: Payer: 59 | Admitting: Family Medicine

## 2016-06-19 ENCOUNTER — Encounter: Payer: Self-pay | Admitting: Family Medicine

## 2016-06-22 ENCOUNTER — Other Ambulatory Visit: Payer: Self-pay | Admitting: Family Medicine

## 2016-06-22 DIAGNOSIS — G4700 Insomnia, unspecified: Secondary | ICD-10-CM

## 2016-06-22 DIAGNOSIS — F411 Generalized anxiety disorder: Secondary | ICD-10-CM

## 2016-06-22 MED ORDER — ALPRAZOLAM 0.5 MG PO TABS: 0.5000 mg | ORAL_TABLET | Freq: Every evening | ORAL | 0 refills | Status: DC | PRN

## 2016-06-22 MED ORDER — DULOXETINE HCL 30 MG PO CPEP: 30.0000 mg | ORAL_CAPSULE | Freq: Every day | ORAL | 1 refills | Status: DC

## 2016-06-22 MED ORDER — DULOXETINE HCL 30 MG PO CPEP: 30.0000 mg | ORAL_CAPSULE | Freq: Every day | ORAL | 0 refills | Status: DC

## 2016-06-22 MED ORDER — DULOXETINE HCL 60 MG PO CPEP: 30.0000 mg | ORAL_CAPSULE | Freq: Every day | ORAL | 0 refills | Status: DC

## 2016-08-04 ENCOUNTER — Ambulatory Visit
Admission: EM | Admit: 2016-08-04 | Discharge: 2016-08-04 | Disposition: A | Discharge status: Home / Self Care | Payer: 59 | Attending: Emergency Medicine | Admitting: Emergency Medicine

## 2016-08-04 ENCOUNTER — Encounter: Payer: Self-pay | Admitting: Emergency Medicine

## 2016-08-04 DIAGNOSIS — J029 Acute pharyngitis, unspecified: Secondary | ICD-10-CM

## 2016-08-04 LAB — RAPID STREP SCREEN (MED CTR MEBANE ONLY): Streptococcus, Group A Screen (Direct): NEGATIVE

## 2016-08-04 MED ORDER — MOMETASONE FUROATE 50 MCG/ACT NA SUSP: 2.0000 | Freq: Every day | NASAL | 0 refills | Status: DC

## 2016-08-04 MED ORDER — IBUPROFEN 600 MG PO TABS: 600.0000 mg | ORAL_TABLET | Freq: Four times a day (QID) | ORAL | 0 refills | Status: DC | PRN

## 2016-08-04 NOTE — ED Provider Notes (Signed)
HPI  SUBJECTIVE:  Patient reports sore throat starting 2 weeks ago. Sx worse at night but not associated with lying down.  Sx better with 1 g of Tylenol. Has been taking Advil PM w/ o relief.  No fevers    No Cough/URI sxs- but she reports nasal congestion, runny nose, postnasal drip. She also reports bilateral ear pain on the right more than left. No hearing changes, otorrhea No Myalgias No Headache No Rash     No Recent Strep or mono Exposure No Abdominal Pain No reflux sxs No Allergy sxs  No Breathing difficulty, voice changes, sensation of throat swelling shut No Drooling No Trismus No abx in past month.  No antipyretic in past 4-6 hrs Pt is not a smoker. Past medical history negative for allergies, GERD, asthma, emphysema, COPD, diabetes, hypertension LMP: 3/10. Denies possibility of being pregnant. [id number] Minerva Ends, MD    Past Medical History:  Diagnosis Date  . Anxiety   . Chronic insomnia   . Eczema   . Right hip pain   . Vegetarian diet     History reviewed. No pertinent surgical history.  Family History  Problem Relation Age of Onset  . Osteoporosis Mother   . Metabolic syndrome Father     Social History  Substance Use Topics  . Smoking status: Never Smoker  . Smokeless tobacco: Never Used  . Alcohol use 0.0 oz/week    No current facility-administered medications for this encounter.   Current Outpatient Prescriptions:  .  ALPRAZolam (XANAX) 0.5 MG tablet, Take 1 tablet (0.5 mg total) by mouth at bedtime as needed for anxiety., Disp: 30 tablet, Rfl: 0 .  cholecalciferol (VITAMIN D) 1000 UNITS tablet, Take 1,000 Units by mouth daily., Disp: , Rfl:  .  DULoxetine (CYMBALTA) 30 MG capsule, Take 1 capsule (30 mg total) by mouth daily., Disp: 90 capsule, Rfl: 1 .  ibuprofen (ADVIL,MOTRIN) 600 MG tablet, Take 1 tablet (600 mg total) by mouth every 6 (six) hours as needed., Disp: 30 tablet, Rfl: 0 .  mometasone (NASONEX) 50 MCG/ACT nasal spray, Place  2 sprays into the nose daily., Disp: 17 g, Rfl: 0 .  Multiple Vitamins-Minerals (MULTI VITAMIN/MINERALS) TABS, Take 1 tablet by mouth daily., Disp: , Rfl:  .  Omega-3 Fatty Acids (FISH OIL) 600 MG CAPS, Take 600 m by mouth once., Disp: , Rfl:  .  polyethylene glycol powder (GLYCOLAX/MIRALAX) powder, Take 17 g by mouth 2 (two) times daily as needed., Disp: 3350 g, Rfl: 1  Allergies  Allergen Reactions  . Penicillins      ROS  As noted in HPI.   Physical Exam  BP 128/84 (BP Location: Left Arm)   Pulse 68   Temp 98.5 F (36.9 C) (Oral)   Resp 16   Ht 5\' 5"  (1.651 m)   Wt 140 lb (63.5 kg)   LMP 07/25/2016 (Exact Date)   SpO2 100%   BMI 23.30 kg/m   Constitutional: Well developed, well nourished, no acute distress Eyes:  EOMI, conjunctiva normal bilaterally HENT: Normocephalic, atraumatic,mucus membranes moist. + mild nasal congestion + erythematous oropharynx - enlarged tonsils - exudates. Uvula midline.  Respiratory: Normal inspiratory effort Cardiovascular: Normal rate, no murmurs, rubs, gallops GI: nondistended, nontender. No appreciable splenomegaly skin: No rash, skin intact Lymph: - cervical LN  Musculoskeletal: no deformities Neurologic: Alert & oriented x 3, no focal neuro deficits Psychiatric: Speech and behavior appropriate.  ED Course   Medications - No data to display  Orders Placed This  Encounter  Procedures  . Rapid strep screen    Standing Status:   Standing    Number of Occurrences:   1  . Culture, group A strep    Standing Status:   Standing    Number of Occurrences:   1    Results for orders placed or performed during the hospital encounter of 08/04/16 (from the past 24 hour(s))  Rapid strep screen     Status: None   Collection Time: 08/04/16  9:22 AM  Result Value Ref Range   Streptococcus, Group A Screen (Direct) NEGATIVE NEGATIVE   No results found.  ED Clinical Impression  Pharyngitis, unspecified etiology   ED  Assessment/Plan  presentation most consistent with pharyngitis due to postnasal drip Rapid strep negative. Obtaining throat culture to guide antibiotic treatment. Discussed this with patient. We'll contact them if culture is positive, and will call in Appropriate antibiotics. Patient home with ibuprofen, Tylenol, Nasonex, Claritin and Allegra or Zyrtec which ever one works best for her Benadryl/Maalox mixture. Patient to followup with PMD when necessary,    Discussed labs, MDM, plan and followup with patient. Discussed sn/sx that should prompt return to the  ED. Patient agrees with plan.  Meds ordered this encounter  Medications  . mometasone (NASONEX) 50 MCG/ACT nasal spray    Sig: Place 2 sprays into the nose daily.    Dispense:  17 g    Refill:  0  . ibuprofen (ADVIL,MOTRIN) 600 MG tablet    Sig: Take 1 tablet (600 mg total) by mouth every 6 (six) hours as needed.    Dispense:  30 tablet    Refill:  0     *This clinic note was created using [employer]. Therefore, there may be occasional mistakes despite careful proofreading.    Melynda Ripple, MD 08/04/16 [phone]

## 2016-08-04 NOTE — Discharge Instructions (Signed)
your rapid strep was negative today, so we have sent off a throat culture.  We will contact you and call in the appropriate antibiotics if your culture comes back positive for an infection requiring antibiotic treatment.  Give [place] a working phone number.   1 gram of Tylenol and 600 mg ibuprofen together 3-4 times a day as needed for pain.  Make sure you drink plenty of extra fluids.  Some people find salt water gargles and  Traditional Medicinal's "Throat Coat" tea helpful. Take 5 mL of liquid Benadryl and 5 mL of Maalox. Mix it together, and then hold it in your mouth for as long as you can and then swallow. You may do this 4 times a day.    Try some Claritin, Allegra or Zyrtec in addition to the Nasonex. If the Nasonex is too expensive, then use Flonase instead Go to www.goodrx.com to look up your medications. This will give you a list of where you can find your prescriptions at the most affordable prices.

## 2016-08-04 NOTE — ED Triage Notes (Signed)
Patient c/o sore throat for over 2 weeks.  Patient c/o bilateral ear pain that started yesterday.

## 2016-08-07 LAB — CULTURE, GROUP A STREP (THRC)

## 2016-10-08 DIAGNOSIS — H5213 Myopia, bilateral: Secondary | ICD-10-CM | POA: Diagnosis not present

## 2016-10-26 DIAGNOSIS — D229 Melanocytic nevi, unspecified: Secondary | ICD-10-CM | POA: Diagnosis not present

## 2016-10-26 DIAGNOSIS — Z86018 Personal history of other benign neoplasm: Secondary | ICD-10-CM | POA: Diagnosis not present

## 2017-01-04 ENCOUNTER — Ambulatory Visit (INDEPENDENT_AMBULATORY_CARE_PROVIDER_SITE_OTHER): Payer: 59 | Admitting: Certified Nurse Midwife

## 2017-01-04 ENCOUNTER — Encounter: Payer: Self-pay | Admitting: Certified Nurse Midwife

## 2017-01-04 VITALS — BP 104/79 | HR 90 | Ht 65.0 in | Wt 141.7 lb

## 2017-01-04 DIAGNOSIS — Z0000 Encounter for general adult medical examination without abnormal findings: Secondary | ICD-10-CM | POA: Diagnosis not present

## 2017-01-04 NOTE — Patient Instructions (Signed)
Preventive Care 18-39 Years, Female Preventive care refers to lifestyle choices and visits with your health care provider that can promote health and wellness. What does preventive care include?  A yearly physical exam. This is also called an annual well check.  Dental exams once or twice a year.  Routine eye exams. Ask your health care provider how often you should have your eyes checked.  Personal lifestyle choices, including: ? Daily care of your teeth and gums. ? Regular physical activity. ? Eating a healthy diet. ? Avoiding tobacco and drug use. ? Limiting alcohol use. ? Practicing safe sex. ? Taking vitamin and mineral supplements as recommended by your health care provider. What happens during an annual well check? The services and screenings done by your health care provider during your annual well check will depend on your age, overall health, lifestyle risk factors, and family history of disease. Counseling Your health care provider may ask you questions about your:  Alcohol use.  Tobacco use.  Drug use.  Emotional well-being.  Home and relationship well-being.  Sexual activity.  Eating habits.  Work and work Statistician.  Method of birth control.  Menstrual cycle.  Pregnancy history.  Screening You may have the following tests or measurements:  Height, weight, and BMI.  Diabetes screening. This is done by checking your blood sugar (glucose) after you have not eaten for a while (fasting).  Blood pressure.  Lipid and cholesterol levels. These may be checked every 5 years starting at age 66.  Skin check.  Hepatitis C blood test.  Hepatitis B blood test.  Sexually transmitted disease (STD) testing.  BRCA-related cancer screening. This may be done if you have a family history of breast, ovarian, tubal, or peritoneal cancers.  Pelvic exam and Pap test. This may be done every 3 years starting at age 40. Starting at age 59, this may be done every 5  years if you have a Pap test in combination with an HPV test.  Discuss your test results, treatment options, and if necessary, the need for more tests with your health care provider. Vaccines Your health care provider may recommend certain vaccines, such as:  Influenza vaccine. This is recommended every year.  Tetanus, diphtheria, and acellular pertussis (Tdap, Td) vaccine. You may need a Td booster every 10 years.  Varicella vaccine. You may need this if you have not been vaccinated.  HPV vaccine. If you are 69 or younger, you may need three doses over 6 months.  Measles, mumps, and rubella (MMR) vaccine. You may need at least one dose of MMR. You may also need a second dose.  Pneumococcal 13-valent conjugate (PCV13) vaccine. You may need this if you have certain conditions and were not previously vaccinated.  Pneumococcal polysaccharide (PPSV23) vaccine. You may need one or two doses if you smoke cigarettes or if you have certain conditions.  Meningococcal vaccine. One dose is recommended if you are age 27-21 years and a first-year college student living in a residence hall, or if you have one of several medical conditions. You may also need additional booster doses.  Hepatitis A vaccine. You may need this if you have certain conditions or if you travel or work in places where you may be exposed to hepatitis A.  Hepatitis B vaccine. You may need this if you have certain conditions or if you travel or work in places where you may be exposed to hepatitis B.  Haemophilus influenzae type b (Hib) vaccine. You may need this if  you have certain risk factors.  Talk to your health care provider about which screenings and vaccines you need and how often you need them. This information is not intended to replace advice given to you by your health care provider. Make sure you discuss any questions you have with your health care provider. Document Released: 06/30/2001 Document Revised: 01/22/2016  Document Reviewed: 03/05/2015 Elsevier Interactive Patient Education  2017 [employer].

## 2017-01-04 NOTE — Progress Notes (Addendum)
ANNUAL PREVENTATIVE CARE GYN  ENCOUNTER NOTE  Subjective:       Christine Ball is a 36 y.o. No obstetric history on file. female here for a routine annual gynecologic exam.  Current complaints: 1.  None   Gynecologic History Patient's last menstrual period was 12/28/2016. Contraception: none, husband had vasectomy Last Pap: 09/16/2015. Results were: normal Last mammogram: N/A.   Obstetric History OB History  Gravida Para Term Preterm AB Living  2 2          SAB TAB Ectopic Multiple Live Births               # Outcome Date GA Lbr Len/2nd Weight Sex Delivery Anes PTL Lv  2 Para 2009    F Vag-Spont     1 Para 2007    F Vag-Spont         Past Medical History:  Diagnosis Date  . Anxiety   . Chronic insomnia   . Eczema   . Right hip pain   . Vegetarian diet     History reviewed. No pertinent surgical history.  Current Outpatient Prescriptions on File Prior to Visit  Medication Sig Dispense Refill  . ALPRAZolam (XANAX) 0.5 MG tablet Take 1 tablet (0.5 mg total) by mouth at bedtime as needed for anxiety. 30 tablet 0  . cholecalciferol (VITAMIN D) 1000 UNITS tablet Take 1,000 Units by mouth daily.    Marland Kitchen ibuprofen (ADVIL,MOTRIN) 600 MG tablet Take 1 tablet (600 mg total) by mouth every 6 (six) hours as needed. 30 tablet 0  . Multiple Vitamins-Minerals (MULTI VITAMIN/MINERALS) TABS Take 1 tablet by mouth daily.    . Omega-3 Fatty Acids (FISH OIL) 600 MG CAPS Take 600 m by mouth once.    . DULoxetine (CYMBALTA) 30 MG capsule Take 1 capsule (30 mg total) by mouth daily. (Patient not taking: Reported on 01/04/2017) 90 capsule 1   No current facility-administered medications on file prior to visit.     Allergies  Allergen Reactions  . Penicillins     Social History   Social History  . Marital status: Married    Spouse name: N/A  . Number of children: N/A  . Years of education: N/A   Occupational History  . Not on file.   Social History Main Topics  . Smoking status:  Never Smoker  . Smokeless tobacco: Never Used  . Alcohol use 0.0 oz/week  . Drug use: No  . Sexual activity: Yes    Partners: Male    Birth control/ protection: None     Comment: husband- vasectomy   Other Topics Concern  . Not on file   Social History Narrative  . No narrative on file    Family History  Problem Relation Age of Onset  . Osteoporosis Mother   . Metabolic syndrome Father     The following portions of the patient's history were reviewed and updated as appropriate: allergies, current medications, past family history, past medical history, past social history, past surgical history and problem list.  Review of Systems ROS Review of Systems - General ROS: negative for - chills, fatigue, fever, hot flashes, night sweats, weight gain or weight loss Psychological ROS: negative for - anxiety, decreased libido, depression, mood swings, physical abuse or sexual abuse Ophthalmic ROS: negative for - blurry vision, eye pain or loss of vision ENT ROS: negative for - headaches, hearing change, visual changes or vocal changes Allergy and Immunology ROS: negative for - hives, itchy/watery eyes or  seasonal allergies Hematological and Lymphatic ROS: negative for - bleeding problems, bruising, swollen lymph nodes or weight loss Endocrine ROS: negative for - galactorrhea, hair pattern changes, hot flashes, malaise/lethargy, mood swings, palpitations, polydipsia/polyuria, skin changes, temperature intolerance or unexpected weight changes Breast ROS: negative for - new or changing breast lumps or nipple discharge Respiratory ROS: negative for - cough or shortness of breath Cardiovascular ROS: negative for - chest pain, irregular heartbeat, palpitations or shortness of breath Gastrointestinal ROS: no abdominal pain, change in bowel habits, or black or bloody stools Genito-Urinary ROS: no dysuria, trouble voiding, or hematuria Musculoskeletal ROS: negative for - joint pain or joint  stiffness Neurological ROS: negative for - bowel and bladder control changes Dermatological ROS: negative for rash and skin lesion changes   Objective:   BP 104/79   Pulse 90   Ht 5\' 5"  (1.651 m)   Wt 141 lb 11.2 oz (64.3 kg)   LMP 12/28/2016 Comment: periods are heavy  BMI 23.58 kg/m  CONSTITUTIONAL: Well-developed, well-nourished female in no acute distress.  PSYCHIATRIC: Normal mood and affect. Normal behavior. Normal judgment and thought content. [place]: Alert and oriented to person, place, and time. Normal muscle tone coordination. No cranial nerve deficit noted. HENT:  Normocephalic, atraumatic, External right and left ear normal. Oropharynx is clear and moist EYES: Conjunctivae and EOM are normal. Pupils are equal, round, and reactive to light. No scleral icterus.  NECK: Normal range of motion, supple, no masses.  Normal thyroid.  SKIN: Skin is warm and dry. No rash noted. Not diaphoretic. No erythema. No pallor. CARDIOVASCULAR: Normal heart rate noted, regular rhythm, no murmur. RESPIRATORY: Clear to auscultation bilaterally. Effort and breath sounds normal, no problems with respiration noted. BREASTS: Symmetric in size. No masses, skin changes, nipple drainage, or lymphadenopathy. ABDOMEN: Soft, normal bowel sounds, no distention noted.  No tenderness, rebound or guarding.  BLADDER: Normal PELVIC:  External Genitalia: Normal  BUS: Normal  Vagina: Normal  Cervix: Normal  Uterus: Normal  Adnexa: Normal  RV: External Exam NormaI  MUSCULOSKELETAL: Normal range of motion. No tenderness.  No cyanosis, clubbing, or edema.  2+ distal pulses. LYMPHATIC: No Axillary, Supraclavicular, or Inguinal Adenopathy.    Assessment:   Annual gynecologic examination 36 y.o. Contraception: vasectomy Normal BMI Problem List Items Addressed This Visit    None      Plan:  Pap: Not needed Mammogram: Not Indicated Stool Guaiac Testing:  Not Indicated Labs: None Routine  preventative health maintenance measures emphasized: Exercise/Diet/Weight control and Stress Management  Return to [place], [place]

## 2017-07-15 DIAGNOSIS — M65872 Other synovitis and tenosynovitis, left ankle and foot: Secondary | ICD-10-CM | POA: Diagnosis not present

## 2017-07-15 DIAGNOSIS — M79672 Pain in left foot: Secondary | ICD-10-CM | POA: Diagnosis not present

## 2017-07-20 ENCOUNTER — Encounter: Payer: Self-pay | Admitting: Family Medicine

## 2017-07-20 ENCOUNTER — Other Ambulatory Visit: Payer: Self-pay | Admitting: Family Medicine

## 2017-07-20 DIAGNOSIS — F411 Generalized anxiety disorder: Secondary | ICD-10-CM

## 2017-07-20 DIAGNOSIS — G4700 Insomnia, unspecified: Secondary | ICD-10-CM

## 2017-07-20 MED ORDER — ALPRAZOLAM 0.5 MG PO TABS: 0.5000 mg | ORAL_TABLET | Freq: Every evening | ORAL | 0 refills | Status: DC | PRN

## 2017-07-20 NOTE — Telephone Encounter (Signed)
Refill request for general medication: Alprazolam 0.5 mg  Last office visit: 05/21/2016  Last physical exam: 01/04/2017 at GYN  Follow-up on file. 01/05/2018

## 2017-08-06 ENCOUNTER — Encounter: Payer: Self-pay | Admitting: Family Medicine

## 2017-08-06 ENCOUNTER — Ambulatory Visit (INDEPENDENT_AMBULATORY_CARE_PROVIDER_SITE_OTHER): Payer: 59 | Admitting: Family Medicine

## 2017-08-06 VITALS — BP 100/74 | HR 106 | Resp 14 | Ht 65.0 in | Wt 141.2 lb

## 2017-08-06 DIAGNOSIS — Z789 Other specified health status: Secondary | ICD-10-CM

## 2017-08-06 DIAGNOSIS — F411 Generalized anxiety disorder: Secondary | ICD-10-CM

## 2017-08-06 DIAGNOSIS — G4700 Insomnia, unspecified: Secondary | ICD-10-CM | POA: Diagnosis not present

## 2017-08-06 MED ORDER — ALPRAZOLAM 0.5 MG PO TABS: 0.5000 mg | ORAL_TABLET | Freq: Every evening | ORAL | 1 refills | Status: DC | PRN

## 2017-08-06 NOTE — Progress Notes (Signed)
Name: Christine Ball   MRN: [id number]    DOB: 1980-05-23   Date:08/06/2017       Progress Note  Subjective  Chief Complaint  Chief Complaint  Patient presents with  . Medication Refill  . Insomnia    HPI  Insomnia: she is doing well taking half pill of Doxylamine otc every night, however when she travels she needs to take Alprazolam and she needs a refill. She denies side effects of medications.   GAD: she has been doing well, she stopped taking Cymbalta last Summer and is doing well.   Patient Active Problem List   Diagnosis Date Noted  . Insomnia, persistent 03/14/2015  . GAD (generalized anxiety disorder) 03/14/2015  . Dermatitis, eczematoid 03/14/2015  . Strict vegetarian diet 03/14/2015  . Nonallopathic lesion of sacral region 10/08/2014  . Nonallopathic lesion of lumbosacral region 10/08/2014  . Gluteal tendinitis of right buttock 08/21/2014    History reviewed. No pertinent surgical history.  Family History  Problem Relation Age of Onset  . Osteoporosis Mother   . Metabolic syndrome Father   . Cancer Paternal Grandfather     Social History   Socioeconomic History  . Marital status: Married    Spouse name: Jaci Standard   . Number of children: 2  . Years of education: Not on file  . Highest education level: Bachelor's degree (e.g., BA, AB, BS)  Occupational History  . Occupation: stay at home mother   Social Needs  . Financial resource strain: Not hard at all  . Food insecurity:    Worry: Never true    Inability: Never true  . Transportation needs:    Medical: No    Non-medical: No  Tobacco Use  . Smoking status: Never Smoker  . Smokeless tobacco: Never Used  Substance and Sexual Activity  . Alcohol use: Yes    Alcohol/week: 0.0 oz  . Drug use: No  . Sexual activity: Yes    Partners: Male    Birth control/protection: None    Comment: husband- vasectomy  Lifestyle  . Physical activity:    Days per week: 5 days    Minutes per session: 70 min  .  Stress: Not at all  Relationships  . Social connections:    Talks on phone: More than three times a week    Gets together: More than three times a week    Attends religious service: More than 4 times per year    Active member of club or organization: Yes    Attends meetings of clubs or organizations: More than 4 times per year    Relationship status: Married  . Intimate partner violence:    Fear of current or ex partner: No    Emotionally abused: No    Physically abused: No    Forced sexual activity: No  Other Topics Concern  . Not on file  Social History Narrative  . Not on file     Current Outpatient Medications:  .  ALPRAZolam (XANAX) 0.5 MG tablet, Take 1 tablet (0.5 mg total) by mouth at bedtime as needed for anxiety., Disp: 7 tablet, Rfl: 0 .  doxylamine, Sleep, (UNISOM) 25 MG tablet, Take 12.5 mg by mouth every evening., Disp: , Rfl:   Allergies  Allergen Reactions  . Penicillins      ROS  Constitutional: Negative for fever or weight change.  Respiratory: Negative for cough and shortness of breath.   Cardiovascular: Negative for chest pain or palpitations.  Gastrointestinal: Negative for abdominal  pain, no bowel changes.  Musculoskeletal: Negative for gait problem or joint swelling.  Skin: Negative for rash.  Neurological: Negative for dizziness or headache.  No other specific complaints in a complete review of systems (except as listed in HPI above).  Objective  Vitals:   08/06/17 0743  BP: 100/74  Pulse: (!) 106  Resp: 14  SpO2: 98%  Weight: 141 lb 3.2 oz (64 kg)  Height: 5\' 5"  (1.651 m)    Body mass index is 23.5 kg/m.  Physical Exam  Constitutional: Patient appears well-developed and well-nourished.  No distress.  HEENT: head atraumatic, normocephalic, pupils equal and reactive to light,  neck supple, throat within normal limits Cardiovascular: Normal rate, regular rhythm and normal heart sounds.  No murmur heard. No BLE edema. Pulmonary/Chest:  Effort normal and breath sounds normal. No respiratory distress. Abdominal: Soft.  There is no tenderness. Psychiatric: Patient has a normal mood and affect. behavior is normal. Judgment and thought content normal.  PHQ2/9: Depression screen [facility] 2/9 08/06/2017 05/21/2016 12/30/2015 03/14/2015  Decreased Interest 0 0 0 0  Down, Depressed, Hopeless 0 0 0 0  PHQ - 2 Score 0 0 0 0  Altered sleeping 1 - - -  Tired, decreased energy 0 - - -  Change in appetite 0 - - -  Feeling bad or failure about yourself  0 - - -  Trouble concentrating 0 - - -  Moving slowly or fidgety/restless 0 - - -  Suicidal thoughts 0 - - -  PHQ-9 Score 1 - - -  Difficult doing work/chores Not difficult at all - - -    GAD 7 : Generalized Anxiety Score 12/30/2015  Nervous, Anxious, on Edge 1  Control/stop worrying 1  Worry too much - different things 1  Trouble relaxing 1  Restless 1  Easily annoyed or irritable 1  Afraid - awful might happen 0  Total GAD 7 Score 6  Anxiety Difficulty Not difficult at all     Fall Risk: Fall Risk  08/06/2017 08/06/2017 05/21/2016 12/30/2015 03/14/2015  Falls in the past year? No No No No No     Functional Status Survey: Is the patient deaf or have difficulty hearing?: No Does the patient have difficulty seeing, even when wearing glasses/contacts?: No Does the patient have difficulty concentrating, remembering, or making decisions?: No Does the patient have difficulty walking or climbing stairs?: No Does the patient have difficulty dressing or bathing?: No Does the patient have difficulty doing errands alone such as visiting a doctor's office or shopping?: No    Assessment & Plan  1. Insomnia, persistent  - ALPRAZolam (XANAX) 0.5 MG tablet; Take 1 tablet (0.5 mg total) by mouth at bedtime as needed for anxiety.  Dispense: 30 tablet; Refill: 1  2. GAD (generalized anxiety disorder)  - ALPRAZolam (XANAX) 0.5 MG tablet; Take 1 tablet (0.5 mg total) by mouth at bedtime as  needed for anxiety.  Dispense: 30 tablet; Refill: 1  3. Vegetarian diet  She eats a balanced diet, last labs were normal, recheck next year. She is eating some chicken now

## 2017-11-09 DIAGNOSIS — L578 Other skin changes due to chronic exposure to nonionizing radiation: Secondary | ICD-10-CM | POA: Diagnosis not present

## 2017-11-09 DIAGNOSIS — D485 Neoplasm of uncertain behavior of skin: Secondary | ICD-10-CM | POA: Diagnosis not present

## 2017-11-09 DIAGNOSIS — Z86018 Personal history of other benign neoplasm: Secondary | ICD-10-CM | POA: Diagnosis not present

## 2017-11-09 DIAGNOSIS — Z1283 Encounter for screening for malignant neoplasm of skin: Secondary | ICD-10-CM | POA: Diagnosis not present

## 2018-01-05 ENCOUNTER — Encounter: Payer: 59 | Admitting: Certified Nurse Midwife

## 2018-01-11 DIAGNOSIS — M65872 Other synovitis and tenosynovitis, left ankle and foot: Secondary | ICD-10-CM | POA: Diagnosis not present

## 2018-01-11 DIAGNOSIS — M79672 Pain in left foot: Secondary | ICD-10-CM | POA: Diagnosis not present

## 2018-01-12 ENCOUNTER — Ambulatory Visit (INDEPENDENT_AMBULATORY_CARE_PROVIDER_SITE_OTHER): Payer: 59 | Admitting: Certified Nurse Midwife

## 2018-01-12 VITALS — BP 110/74 | HR 65 | Ht 65.0 in | Wt 142.1 lb

## 2018-01-12 DIAGNOSIS — Z0000 Encounter for general adult medical examination without abnormal findings: Secondary | ICD-10-CM

## 2018-01-12 NOTE — Progress Notes (Signed)
Pt is here for an annual exam.

## 2018-01-12 NOTE — Patient Instructions (Signed)
Preventive Care 18-39 Years, Female Preventive care refers to lifestyle choices and visits with your health care provider that can promote health and wellness. What does preventive care include?  A yearly physical exam. This is also called an annual well check.  Dental exams once or twice a year.  Routine eye exams. Ask your health care provider how often you should have your eyes checked.  Personal lifestyle choices, including: ? Daily care of your teeth and gums. ? Regular physical activity. ? Eating a healthy diet. ? Avoiding tobacco and drug use. ? Limiting alcohol use. ? Practicing safe sex. ? Taking vitamin and mineral supplements as recommended by your health care provider. What happens during an annual well check? The services and screenings done by your health care provider during your annual well check will depend on your age, overall health, lifestyle risk factors, and family history of disease. Counseling Your health care provider may ask you questions about your:  Alcohol use.  Tobacco use.  Drug use.  Emotional well-being.  Home and relationship well-being.  Sexual activity.  Eating habits.  Work and work Statistician.  Method of birth control.  Menstrual cycle.  Pregnancy history.  Screening You may have the following tests or measurements:  Height, weight, and BMI.  Diabetes screening. This is done by checking your blood sugar (glucose) after you have not eaten for a while (fasting).  Blood pressure.  Lipid and cholesterol levels. These may be checked every 5 years starting at age 78.  Skin check.  Hepatitis C blood test.  Hepatitis B blood test.  Sexually transmitted disease (STD) testing.  BRCA-related cancer screening. This may be done if you have a family history of breast, ovarian, tubal, or peritoneal cancers.  Pelvic exam and Pap test. This may be done every 3 years starting at age 39. Starting at age 65, this may be done every 5  years if you have a Pap test in combination with an HPV test.  Discuss your test results, treatment options, and if necessary, the need for more tests with your health care provider. Vaccines Your health care provider may recommend certain vaccines, such as:  Influenza vaccine. This is recommended every year.  Tetanus, diphtheria, and acellular pertussis (Tdap, Td) vaccine. You may need a Td booster every 10 years.  Varicella vaccine. You may need this if you have not been vaccinated.  HPV vaccine. If you are 56 or younger, you may need three doses over 6 months.  Measles, mumps, and rubella (MMR) vaccine. You may need at least one dose of MMR. You may also need a second dose.  Pneumococcal 13-valent conjugate (PCV13) vaccine. You may need this if you have certain conditions and were not previously vaccinated.  Pneumococcal polysaccharide (PPSV23) vaccine. You may need one or two doses if you smoke cigarettes or if you have certain conditions.  Meningococcal vaccine. One dose is recommended if you are age 64-21 years and a first-year college student living in a residence hall, or if you have one of several medical conditions. You may also need additional booster doses.  Hepatitis A vaccine. You may need this if you have certain conditions or if you travel or work in places where you may be exposed to hepatitis A.  Hepatitis B vaccine. You may need this if you have certain conditions or if you travel or work in places where you may be exposed to hepatitis B.  Haemophilus influenzae type b (Hib) vaccine. You may need this if  you have certain risk factors.  Talk to your health care provider about which screenings and vaccines you need and how often you need them. This information is not intended to replace advice given to you by your health care provider. Make sure you discuss any questions you have with your health care provider. Document Released: 06/30/2001 Document Revised: 01/22/2016  Document Reviewed: 03/05/2015 Elsevier Interactive Patient Education  [employer].

## 2018-01-12 NOTE — Progress Notes (Signed)
GYNECOLOGY ANNUAL PREVENTATIVE CARE ENCOUNTER NOTE  Subjective:   Christine Ball is a 37 y.o. G2P2 female here for a routine annual gynecologic exam.  Current complaints: none   Denies abnormal vaginal bleeding, discharge, pelvic pain, problems with intercourse or other gynecologic concerns.    Gynecologic History Patient's last menstrual period was 01/01/2018 (exact date). Contraception: vasectomy Last Pap: 2017. Results were: normal Last mammogram: N/A.  Obstetric History OB History  Gravida Para Term Preterm AB Living  2 2          SAB TAB Ectopic Multiple Live Births               # Outcome Date GA Lbr Len/2nd Weight Sex Delivery Anes PTL Lv  2 Para 2009    F Vag-Spont     1 Para 2007    F Vag-Spont       Past Medical History:  Diagnosis Date  . Anxiety   . Chronic insomnia   . Eczema   . Right hip pain   . Vegetarian diet     No past surgical history on file.  Current Outpatient Medications on File Prior to Visit  Medication Sig Dispense Refill  . ALPRAZolam (XANAX) 0.5 MG tablet Take 1 tablet (0.5 mg total) by mouth at bedtime as needed for anxiety. 30 tablet 1  . doxylamine, Sleep, (UNISOM) 25 MG tablet Take 12.5 mg by mouth every evening.     No current facility-administered medications on file prior to visit.     Allergies  Allergen Reactions  . Penicillins     Social History:  reports that she has never smoked. She has never used smokeless tobacco. She reports that she drinks alcohol. She reports that she does not use drugs.  Family History  Problem Relation Age of Onset  . Osteoporosis Mother   . Metabolic syndrome Father   . Cancer Paternal Grandfather     The following portions of the patient's history were reviewed and updated as appropriate: allergies, current medications, past family history, past medical history, past social history, past surgical history and problem list.  Review of Systems Pertinent items noted in HPI and remainder  of comprehensive ROS otherwise negative.   Objective:  BP 110/74   Pulse 65   Ht 5\' 5"  (1.651 m)   Wt 142 lb 2 oz (64.5 kg)   LMP 01/01/2018 (Exact Date)   BMI 23.65 kg/m  CONSTITUTIONAL: Well-developed, well-nourished female in no acute distress.  HENT:  Normocephalic, atraumatic, External right and left ear normal. Oropharynx is clear and moist EYES: Conjunctivae and EOM are normal. Pupils are equal, round, and reactive to light. No scleral icterus.  NECK: Normal range of motion, supple, no masses.  Normal thyroid.  SKIN: Skin is warm and dry. No rash noted. Not diaphoretic. No erythema. No pallor. MUSCULOSKELETAL: Normal range of motion. No tenderness.  No cyanosis, clubbing, or edema.  2+ distal pulses. NEUROLOGIC: Alert and oriented to person, place, and time. Normal reflexes, muscle tone coordination. No cranial nerve deficit noted. PSYCHIATRIC: Normal mood and affect. Normal behavior. Normal judgment and thought content. CARDIOVASCULAR: Normal heart rate noted, regular rhythm RESPIRATORY: Clear to auscultation bilaterally. Effort and breath sounds normal, no problems with respiration noted. BREASTS: Symmetric in size. No masses, skin changes, nipple drainage, or lymphadenopathy. ABDOMEN: Soft, normal bowel sounds, no distention noted.  No tenderness, rebound or guarding.  PELVIC: Normal appearing external genitalia; normal appearing vaginal mucosa and cervix.  No abnormal discharge noted.  Not indicated until next year.  Normal uterine size, no other palpable masses, no uterine or adnexal tenderness.    Assessment and Plan:  1. Annual physical exam  Pap due [place] completed with PCP Routine preventative health maintenance measures emphasized. Please refer to After Visit Summary for other counseling recommendations.   Philip Aspen, CNM

## 2018-06-14 DIAGNOSIS — L578 Other skin changes due to chronic exposure to nonionizing radiation: Secondary | ICD-10-CM | POA: Diagnosis not present

## 2018-06-14 DIAGNOSIS — Z86018 Personal history of other benign neoplasm: Secondary | ICD-10-CM | POA: Diagnosis not present

## 2018-06-14 DIAGNOSIS — D2239 Melanocytic nevi of other parts of face: Secondary | ICD-10-CM | POA: Diagnosis not present

## 2018-06-14 DIAGNOSIS — D2272 Melanocytic nevi of left lower limb, including hip: Secondary | ICD-10-CM | POA: Diagnosis not present

## 2018-11-14 ENCOUNTER — Telehealth: Payer: Self-pay | Admitting: Family Medicine

## 2018-11-14 DIAGNOSIS — L578 Other skin changes due to chronic exposure to nonionizing radiation: Secondary | ICD-10-CM | POA: Diagnosis not present

## 2018-11-14 DIAGNOSIS — L650 Telogen effluvium: Secondary | ICD-10-CM | POA: Diagnosis not present

## 2018-11-14 DIAGNOSIS — Z86018 Personal history of other benign neoplasm: Secondary | ICD-10-CM | POA: Diagnosis not present

## 2018-11-14 DIAGNOSIS — Z01419 Encounter for gynecological examination (general) (routine) without abnormal findings: Secondary | ICD-10-CM

## 2018-11-14 NOTE — Telephone Encounter (Signed)
Pt is scheduled for a CPE on 12/15/2018 and would like and would like to have labs done before her visit.  If possible please contact patient once order has been placed.  She will use our in office lab.

## 2018-11-15 ENCOUNTER — Other Ambulatory Visit: Payer: Self-pay

## 2018-11-15 DIAGNOSIS — Z01419 Encounter for gynecological examination (general) (routine) without abnormal findings: Secondary | ICD-10-CM

## 2018-11-15 NOTE — Telephone Encounter (Signed)
Patient notified labs are upfront and ready for her to pick up. Patient also states she has labs from her Dermatologist and informed she is able to go to [facility] to have both of them drawn at the same time.

## 2018-11-16 DIAGNOSIS — L650 Telogen effluvium: Secondary | ICD-10-CM | POA: Diagnosis not present

## 2018-11-16 DIAGNOSIS — Z01419 Encounter for gynecological examination (general) (routine) without abnormal findings: Secondary | ICD-10-CM | POA: Diagnosis not present

## 2018-11-17 LAB — CMP14+EGFR
ALT: 20 IU/L (ref 0–32)
AST: 19 IU/L (ref 0–40)
Albumin/Globulin Ratio: 2 (ref 1.2–2.2)
Albumin: 4.6 g/dL (ref 3.8–4.8)
Alkaline Phosphatase: 57 IU/L (ref 39–117)
BUN/Creatinine Ratio: 12 (ref 9–23)
BUN: 11 mg/dL (ref 6–20)
Bilirubin Total: 0.4 mg/dL (ref 0.0–1.2)
CO2: 22 mmol/L (ref 20–29)
Calcium: 9.5 mg/dL (ref 8.7–10.2)
Chloride: 105 mmol/L (ref 96–106)
Creatinine, Ser: 0.91 mg/dL (ref 0.57–1.00)
GFR calc Af Amer: 92 mL/min/{1.73_m2} (ref >59)
GFR calc non Af Amer: 80 mL/min/{1.73_m2} (ref >59)
Globulin, Total: 2.3 g/dL (ref 1.5–4.5)
Glucose: 90 mg/dL (ref 65–99)
Potassium: 4.6 mmol/L (ref 3.5–5.2)
Sodium: 140 mmol/L (ref 134–144)
Total Protein: 6.9 g/dL (ref 6.0–8.5)

## 2018-11-17 LAB — CBC WITH DIFFERENTIAL/PLATELET
Basophils Absolute: 0 10*3/uL (ref 0.0–0.2)
Basos: 1 %
EOS (ABSOLUTE): 0.1 10*3/uL (ref 0.0–0.4)
Eos: 2 %
Hematocrit: 40.4 % (ref 34.0–46.6)
Hemoglobin: 12.8 g/dL (ref 11.1–15.9)
Immature Grans (Abs): 0 10*3/uL (ref 0.0–0.1)
Immature Granulocytes: 0 %
Lymphocytes Absolute: 1.7 10*3/uL (ref 0.7–3.1)
Lymphs: 32 %
MCH: 26.4 pg — ABNORMAL LOW (ref 26.6–33.0)
MCHC: 31.7 g/dL (ref 31.5–35.7)
MCV: 83 fL (ref 79–97)
Monocytes Absolute: 0.6 10*3/uL (ref 0.1–0.9)
Monocytes: 11 %
Neutrophils Absolute: 2.9 10*3/uL (ref 1.4–7.0)
Neutrophils: 54 %
Platelets: 298 10*3/uL (ref 150–450)
RBC: 4.85 x10E6/uL (ref 3.77–5.28)
RDW: 13.9 % (ref 11.7–15.4)
WBC: 5.2 10*3/uL (ref 3.4–10.8)

## 2018-11-17 LAB — HEMOGLOBIN A1C
Est. average glucose Bld gHb Est-mCnc: 108 mg/dL
Hgb A1c MFr Bld: 5.4 % (ref 4.8–5.6)

## 2018-11-17 LAB — LIPID PANEL
Chol/HDL Ratio: 3.1 ratio (ref 0.0–4.4)
Cholesterol, Total: 169 mg/dL (ref 100–199)
HDL: 54 mg/dL (ref >39)
LDL Calculated: 96 mg/dL (ref 0–99)
Triglycerides: 95 mg/dL (ref 0–149)
VLDL Cholesterol Cal: 19 mg/dL (ref 5–40)

## 2018-11-22 ENCOUNTER — Encounter: Payer: Self-pay | Admitting: Family Medicine

## 2018-12-14 NOTE — Progress Notes (Signed)
Name: Christine Ball   MRN: [id number]    DOB: 02-12-1981   Date:12/15/2018       Progress Note  Subjective  Chief Complaint  Chief Complaint  Patient presents with  . Annual Exam    HPI   Patient presents for annual CPE   GAD: she has been off Duloxetine for 2 years , she is doing well, she is seeing a counseling as needed and it has been helpful.   Hair loss: she has noticed that since she stopped taking Vitamin D, fish oil and multivitamin daily since she stopped taking duloxetine daily. Reviewed labs done by dermatologist, she will try taking hair and nail vitamins and monitor   Diet: she is now eating some chicken and [place] 4 or 5 times a week Exercise:   USPSTF grade A and B recommendations    Office Visit from 12/15/2018 in [facility]  AUDIT-C Score  2     Depression: Phq 9 is  negative Depression screen [facility] 2/9 12/15/2018 08/06/2017 05/21/2016 12/30/2015 03/14/2015  Decreased Interest 0 0 0 0 0  Down, Depressed, Hopeless 0 0 0 0 0  PHQ - 2 Score 0 0 0 0 0  Altered sleeping 0 1 - - -  Tired, decreased energy 0 0 - - -  Change in appetite 0 0 - - -  Feeling bad or failure about yourself  0 0 - - -  Trouble concentrating 0 0 - - -  Moving slowly or fidgety/restless 0 0 - - -  Suicidal thoughts 0 0 - - -  PHQ-9 Score 0 1 - - -  Difficult doing work/chores - Not difficult at all - - -   Hypertension: BP Readings from Last 3 Encounters:  12/15/18 110/72  01/12/18 110/74  08/06/17 100/74   Obesity: Wt Readings from Last 3 Encounters:  12/15/18 141 lb 11.2 oz (64.3 kg)  01/12/18 142 lb 2 oz (64.5 kg)  08/06/17 141 lb 3.2 oz (64 kg)   BMI Readings from Last 3 Encounters:  12/15/18 22.87 kg/m  01/12/18 23.65 kg/m  08/06/17 23.50 kg/m    Hep C Screening: next year, she just had labs done  STD testing and prevention (HIV/chl/gon/syphilis): N/A Intimate partner violence: negative  Sexual History/Pain during Intercourse: no pain ,  discomfort or bleeding  Menstrual History/LMP/Abnormal Bleeding: cycles are regular, but closer together, also getting heavier  Incontinence Symptoms: no symptoms   Advanced Care Planning: A voluntary discussion about advance care planning including the explanation and discussion of advance directives.  Discussed health care proxy and Living will, and the patient was able to identify a health care proxy as husband .  Patient does have a living will at present time.   Breast cancer: breast exam today, mammogram starting at age 89 BRCA gene screening: mother recently diagnosed  Cervical cancer screening: she goes to gyn   Osteoporosis prevention: she is physically active and has a high dairy intake   Lipids:  Lab Results  Component Value Date   CHOL 169 11/16/2018   CHOL 161 03/20/2015   CHOL 111 10/03/2012   Lab Results  Component Value Date   HDL 54 11/16/2018   HDL 48 03/20/2015   HDL 44 10/03/2012   Lab Results  Component Value Date   LDLCALC 96 11/16/2018   [place] 95 03/20/2015   LDLCALC 59 10/03/2012   Lab Results  Component Value Date   TRIG 95 11/16/2018   TRIG 91 03/20/2015  TRIG 40 10/03/2012   Lab Results  Component Value Date   CHOLHDL 3.1 11/16/2018   CHOLHDL 3.4 03/20/2015   No results found for: LDLDIRECT  Glucose:  Glucose  Date Value Ref Range Status  11/16/2018 90 65 - 99 mg/dL Final  03/20/2015 87 65 - 99 mg/dL Final    Skin cancer: discussed atypical lesions   Patient Active Problem List   Diagnosis Date Noted  . Insomnia, persistent 03/14/2015  . GAD (generalized anxiety disorder) 03/14/2015  . Dermatitis, eczematoid 03/14/2015  . Strict vegetarian diet 03/14/2015  . Nonallopathic lesion of sacral region 10/08/2014  . Nonallopathic lesion of lumbosacral region 10/08/2014  . Gluteal tendinitis of right buttock 08/21/2014    History reviewed. No pertinent surgical history.  Family History  Problem Relation Age of Onset  .  Osteoporosis Mother   . Breast cancer Mother   . Metabolic syndrome Father   . Cancer Paternal Grandfather     Social History   Socioeconomic History  . Marital status: Married    Spouse name: Jaci Standard   . Number of children: 2  . Years of education: Not on file  . Highest education level: Bachelor's degree (e.g., BA, AB, BS)  Occupational History  . Occupation: stay at home mother   Social Needs  . Financial resource strain: Not hard at all  . Food insecurity    Worry: Never true    Inability: Never true  . Transportation needs    Medical: No    Non-medical: No  Tobacco Use  . Smoking status: Never Smoker  . Smokeless tobacco: Never Used  Substance and Sexual Activity  . Alcohol use: Yes    Alcohol/week: 0.0 standard drinks  . Drug use: No  . Sexual activity: Yes    Partners: Male    Birth control/protection: None    Comment: husband- vasectomy  Lifestyle  . Physical activity    Days per week: 5 days    Minutes per session: 70 min  . Stress: Not at all  Relationships  . Social connections    Talks on phone: More than three times a week    Gets together: More than three times a week    Attends religious service: More than 4 times per year    Active member of club or organization: Yes    Attends meetings of clubs or organizations: More than 4 times per year    Relationship status: Married  . Intimate partner violence    Fear of current or ex partner: No    Emotionally abused: No    Physically abused: No    Forced sexual activity: No  Other Topics Concern  . Not on file  Social History Narrative  . Not on file     Current Outpatient Medications:  .  ALPRAZolam (XANAX) 0.5 MG tablet, Take 1 tablet (0.5 mg total) by mouth at bedtime as needed for anxiety., Disp: 30 tablet, Rfl: 1 .  doxylamine, Sleep, (UNISOM) 25 MG tablet, Take 12.5 mg by mouth every evening., Disp: , Rfl:   Allergies  Allergen Reactions  . Penicillins      ROS  Constitutional:  Negative for fever or weight change.  Respiratory: Negative for cough and shortness of breath.   Cardiovascular: Negative for chest pain or palpitations.  Gastrointestinal: Negative for abdominal pain, no bowel changes.  Musculoskeletal: Negative for gait problem or joint swelling.  Skin: Negative for rash.  Neurological: Negative for dizziness or headache.  No other specific  complaints in a complete review of systems (except as listed in HPI above).  Objective  Vitals:   12/15/18 0719  BP: 110/72  Pulse: 96  Resp: 16  Temp: 97.8 F (36.6 C)  TempSrc: Temporal  SpO2: 98%  Weight: 141 lb 11.2 oz (64.3 kg)  Height: _0  (1.676 m)    Body mass index is 22.87 kg/m.  Physical Exam  Constitutional: Patient appears well-developed and well-nourished. No distress.  HENT: Head: Normocephalic and atraumatic. Ears: B TMs ok, no erythema or effusion; Nose: Nose normal. Eyes: Conjunctivae and EOM are normal. Pupils are equal, round, and reactive to light. No scleral icterus.  Neck: Normal range of motion. Neck supple. No JVD present. No thyromegaly present.  Cardiovascular: Normal rate, regular rhythm and normal heart sounds.  No murmur heard. No BLE edema. Pulmonary/Chest: Effort normal and breath sounds normal. No respiratory distress. Abdominal: Soft. Bowel sounds are normal, no distension. There is no tenderness. no masses Breast: no lumps or masses, no nipple discharge or rashes FEMALE GENITALIA: not done, seeing gyn in 2 weeks RECTAL: not done Musculoskeletal: Normal range of motion, no joint effusions. No gross deformities Neurological: he is alert and oriented to person, place, and time. No cranial nerve deficit. Coordination, balance, strength, speech and gait are normal.  Skin: Skin is warm and dry. No rash noted. No erythema.  Psychiatric: Patient has a normal mood and affect. behavior is normal. Judgment and thought content normal.   Recent Results (from the past 2160  hour(s))  CMP14+EGFR     Status: None   Collection Time: 11/16/18 10:01 AM  Result Value Ref Range   Glucose 90 65 - 99 mg/dL   BUN 11 6 - 20 mg/dL   Creatinine, Ser 0.91 0.57 - 1.00 mg/dL   GFR calc non Af Amer 80 >59 mL/min/1.73   GFR calc Af Amer 92 >59 mL/min/1.73   BUN/Creatinine Ratio 12 9 - 23   Sodium 140 134 - 144 mmol/L   Potassium 4.6 3.5 - 5.2 mmol/L   Chloride 105 96 - 106 mmol/L   CO2 22 20 - 29 mmol/L   Calcium 9.5 8.7 - 10.2 mg/dL   Total Protein 6.9 6.0 - 8.5 g/dL   Albumin 4.6 3.8 - 4.8 g/dL   Globulin, Total 2.3 1.5 - 4.5 g/dL   Albumin/Globulin Ratio 2.0 1.2 - 2.2   Bilirubin Total 0.4 0.0 - 1.2 mg/dL   Alkaline Phosphatase 57 39 - 117 IU/L   AST 19 0 - 40 IU/L   ALT 20 0 - 32 IU/L  Lipid panel     Status: None   Collection Time: 11/16/18 10:01 AM  Result Value Ref Range   Cholesterol, Total 169 100 - 199 mg/dL   Triglycerides 95 0 - 149 mg/dL   HDL 54 >39 mg/dL   VLDL Cholesterol Cal 19 5 - 40 mg/dL   LDL Calculated 96 0 - 99 mg/dL   Chol/HDL Ratio 3.1 0.0 - 4.4 ratio    Comment:                                   T. Chol/HDL Ratio                                             Men  Women                               1/2 Avg.Risk  3.4    3.3                                   Avg.Risk  5.0    4.4                                2X Avg.Risk  9.6    7.1                                3X Avg.Risk 23.4   11.0   CBC with Differential/Platelet     Status: Abnormal   Collection Time: 11/16/18 10:01 AM  Result Value Ref Range   WBC 5.2 3.4 - 10.8 x10E3/uL   RBC 4.85 3.77 - 5.28 x10E6/uL   Hemoglobin 12.8 11.1 - 15.9 g/dL   Hematocrit 40.4 34.0 - 46.6 %   MCV 83 79 - 97 fL   MCH 26.4 (L) 26.6 - 33.0 pg   MCHC 31.7 31.5 - 35.7 g/dL   RDW 13.9 11.7 - 15.4 %   Platelets 298 150 - 450 x10E3/uL   Neutrophils 54 Not Estab. %   Lymphs 32 Not Estab. %   Monocytes 11 Not Estab. %   Eos 2 Not Estab. %   Basos 1 Not Estab. %   Neutrophils Absolute 2.9 1.4 - 7.0  x10E3/uL   Lymphocytes Absolute 1.7 0.7 - 3.1 x10E3/uL   Monocytes Absolute 0.6 0.1 - 0.9 x10E3/uL   EOS (ABSOLUTE) 0.1 0.0 - 0.4 x10E3/uL   Basophils Absolute 0.0 0.0 - 0.2 x10E3/uL   Immature Granulocytes 0 Not Estab. %   Immature Grans (Abs) 0.0 0.0 - 0.1 x10E3/uL  Hemoglobin A1c     Status: None   Collection Time: 11/16/18 10:01 AM  Result Value Ref Range   Hgb A1c MFr Bld 5.4 4.8 - 5.6 %    Comment:          Prediabetes: 5.7 - 6.4          Diabetes: >6.4          Glycemic control for adults with diabetes: <7.0    Est. average glucose Bld gHb Est-mCnc 108 mg/dL      PHQ2/9: Depression screen [facility] 2/9 12/15/2018 08/06/2017 05/21/2016 12/30/2015 03/14/2015  Decreased Interest 0 0 0 0 0  Down, Depressed, Hopeless 0 0 0 0 0  PHQ - 2 Score 0 0 0 0 0  Altered sleeping 0 1 - - -  Tired, decreased energy 0 0 - - -  Change in appetite 0 0 - - -  Feeling bad or failure about yourself  0 0 - - -  Trouble concentrating 0 0 - - -  Moving slowly or fidgety/restless 0 0 - - -  Suicidal thoughts 0 0 - - -  PHQ-9 Score 0 1 - - -  Difficult doing work/chores - Not difficult at all - - -   GAD 7 : Generalized Anxiety Score 12/15/2018 12/30/2015  Nervous, Anxious, on Edge 1 1  Control/stop worrying 0 1  Worry too much - different things 0 1  Trouble relaxing 0  1  Restless 1 1  Easily annoyed or irritable 1 1  Afraid - awful might happen 0 0  Total GAD 7 Score 3 6  Anxiety Difficulty Not difficult at all Not difficult at all    Fall Risk: Fall Risk  12/15/2018 08/06/2017 08/06/2017 05/21/2016 12/30/2015  Falls in the past year? 0 No No No No  Number falls in past yr: 0 - - - -  Injury with Fall? 0 - - - -    Functional Status Survey: Is the patient deaf or have difficulty hearing?: No Does the patient have difficulty seeing, even when wearing glasses/contacts?: No Does the patient have difficulty concentrating, remembering, or making decisions?: No Does the patient have difficulty walking  or climbing stairs?: No Does the patient have difficulty dressing or bathing?: No Does the patient have difficulty doing errands alone such as visiting a doctor's office or shopping?: No   Assessment & Plan  1. Well woman exam   -USPSTF grade A and B recommendations reviewed with patient; age-appropriate recommendations, preventive care, screening tests, etc discussed and encouraged; healthy living encouraged; see AVS for patient education given to patient -Discussed importance of 150 minutes of physical activity weekly, eat two servings of fish weekly, eat one serving of tree nuts ( cashews, pistachios, pecans, almonds.Marland Kitchen) every other day, eat 6 servings of fruit/vegetables daily and drink plenty of water and avoid sweet beverages.

## 2018-12-15 ENCOUNTER — Encounter: Payer: Self-pay | Admitting: Family Medicine

## 2018-12-15 ENCOUNTER — Ambulatory Visit (INDEPENDENT_AMBULATORY_CARE_PROVIDER_SITE_OTHER): Payer: 59 | Admitting: Family Medicine

## 2018-12-15 ENCOUNTER — Other Ambulatory Visit: Payer: Self-pay

## 2018-12-15 VITALS — BP 110/72 | HR 96 | Temp 97.8°F | Resp 16 | Ht 66.0 in | Wt 141.7 lb

## 2018-12-15 DIAGNOSIS — Z01419 Encounter for gynecological examination (general) (routine) without abnormal findings: Secondary | ICD-10-CM | POA: Diagnosis not present

## 2018-12-15 NOTE — Patient Instructions (Addendum)
Preventive Care 21-39 Years Old, Female Preventive care refers to visits with your health care provider and lifestyle choices that can promote health and wellness. This includes:  A yearly physical exam. This may also be called an annual well check.  Regular dental visits and eye exams.  Immunizations.  Screening for certain conditions.  Healthy lifestyle choices, such as eating a healthy diet, getting regular exercise, not using drugs or products that contain nicotine and tobacco, and limiting alcohol use. What can I expect for my preventive care visit? Physical exam Your health care provider will check your:  Height and weight. This may be used to calculate body mass index (BMI), which tells if you are at a healthy weight.  Heart rate and blood pressure.  Skin for abnormal spots. Counseling Your health care provider may ask you questions about your:  Alcohol, tobacco, and drug use.  Emotional well-being.  Home and relationship well-being.  Sexual activity.  Eating habits.  Work and work environment.  Method of birth control.  Menstrual cycle.  Pregnancy history. What immunizations do I need?  Influenza (flu) vaccine  This is recommended every year. Tetanus, diphtheria, and pertussis (Tdap) vaccine  You may need a Td booster every 10 years. Varicella (chickenpox) vaccine  You may need this if you have not been vaccinated. Human papillomavirus (HPV) vaccine  If recommended by your health care provider, you may need three doses over 6 months. Measles, mumps, and rubella (MMR) vaccine  You may need at least one dose of MMR. You may also need a second dose. Meningococcal conjugate (MenACWY) vaccine  One dose is recommended if you are age 19-21 years and a first-year college student living in a residence hall, or if you have one of several medical conditions. You may also need additional booster doses. Pneumococcal conjugate (PCV13) vaccine  You may need  this if you have certain conditions and were not previously vaccinated. Pneumococcal polysaccharide (PPSV23) vaccine  You may need one or two doses if you smoke cigarettes or if you have certain conditions. Hepatitis A vaccine  You may need this if you have certain conditions or if you travel or work in places where you may be exposed to hepatitis A. Hepatitis B vaccine  You may need this if you have certain conditions or if you travel or work in places where you may be exposed to hepatitis B. Haemophilus influenzae type b (Hib) vaccine  You may need this if you have certain conditions. You may receive vaccines as individual doses or as more than one vaccine together in one shot (combination vaccines). Talk with your health care provider about the risks and benefits of combination vaccines. What tests do I need?  Blood tests  Lipid and cholesterol levels. These may be checked every 5 years starting at age 20.  Hepatitis C test.  Hepatitis B test. Screening  Diabetes screening. This is done by checking your blood sugar (glucose) after you have not eaten for a while (fasting).  Sexually transmitted disease (STD) testing.  BRCA-related cancer screening. This may be done if you have a family history of breast, ovarian, tubal, or peritoneal cancers.  Pelvic exam and Pap test. This may be done every 3 years starting at age 21. Starting at age 30, this may be done every 5 years if you have a Pap test in combination with an HPV test. Talk with your health care provider about your test results, treatment options, and if necessary, the need for more tests.   Follow these instructions at home: Eating and drinking   Eat a diet that includes fresh fruits and vegetables, whole grains, lean protein, and low-fat dairy.  Take vitamin and mineral supplements as recommended by your health care provider.  Do not drink alcohol if: ? Your health care provider tells you not to drink. ? You are  pregnant, may be pregnant, or are planning to become pregnant.  If you drink alcohol: ? Limit how much you have to 0-1 drink a day. ? Be aware of how much alcohol is in your drink. In the [place] one drink equals one 12 oz bottle of beer (355 mL), one 5 oz glass of wine (148 mL), or one 1 oz glass of hard liquor (44 mL). Lifestyle  Take daily care of your teeth and gums.  Stay active. Exercise for at least 30 minutes on 5 or more days each week.  Do not use any products that contain nicotine or tobacco, such as cigarettes, e-cigarettes, and chewing tobacco. If you need help quitting, ask your health care provider.  If you are sexually active, practice safe sex. Use a condom or other form of birth control (contraception) in order to prevent pregnancy and STIs (sexually transmitted infections). If you plan to become pregnant, see your health care provider for a preconception visit. What's next?  Visit your health care provider once a year for a well check visit.  Ask your health care provider how often you should have your eyes and teeth checked.  Stay up to date on all vaccines. This information is not intended to replace advice given to you by your health care provider. Make sure you discuss any questions you have with your health care provider. Document Released: 06/30/2001 Document Revised: 01/13/2018 Document Reviewed: 01/13/2018 Elsevier Patient Education  [place].  Levonorgestrel intrauterine device (IUD) What is this medicine? LEVONORGESTREL IUD (LEE voe nor jes trel) is a contraceptive (birth control) device. The device is placed inside the uterus by a healthcare professional. It is used to prevent pregnancy. This device can also be used to treat heavy bleeding that occurs during your period. This medicine may be used for other purposes; ask your health care provider or pharmacist if you have questions. COMMON BRAND NAME(S): Minette Headland

## 2019-01-16 ENCOUNTER — Encounter: Payer: Self-pay | Admitting: Certified Nurse Midwife

## 2019-01-16 ENCOUNTER — Other Ambulatory Visit (HOSPITAL_COMMUNITY)
Admission: RE | Admit: 2019-01-16 | Discharge: 2019-01-16 | Disposition: A | Discharge status: Home / Self Care | Payer: 59 | Source: Ambulatory Visit | Attending: Certified Nurse Midwife | Admitting: Certified Nurse Midwife

## 2019-01-16 ENCOUNTER — Other Ambulatory Visit: Payer: Self-pay

## 2019-01-16 ENCOUNTER — Ambulatory Visit (INDEPENDENT_AMBULATORY_CARE_PROVIDER_SITE_OTHER): Payer: 59 | Admitting: Certified Nurse Midwife

## 2019-01-16 VITALS — BP 117/81 | HR 75 | Ht 66.0 in | Wt 142.3 lb

## 2019-01-16 DIAGNOSIS — Z01419 Encounter for gynecological examination (general) (routine) without abnormal findings: Secondary | ICD-10-CM | POA: Diagnosis not present

## 2019-01-16 DIAGNOSIS — Z124 Encounter for screening for malignant neoplasm of cervix: Secondary | ICD-10-CM | POA: Insufficient documentation

## 2019-01-16 NOTE — Patient Instructions (Signed)
Preventive Care 21-39 Years Old, Female Preventive care refers to visits with your health care provider and lifestyle choices that can promote health and wellness. This includes:  A yearly physical exam. This may also be called an annual well check.  Regular dental visits and eye exams.  Immunizations.  Screening for certain conditions.  Healthy lifestyle choices, such as eating a healthy diet, getting regular exercise, not using drugs or products that contain nicotine and tobacco, and limiting alcohol use. What can I expect for my preventive care visit? Physical exam Your health care provider will check your:  Height and weight. This may be used to calculate body mass index (BMI), which tells if you are at a healthy weight.  Heart rate and blood pressure.  Skin for abnormal spots. Counseling Your health care provider may ask you questions about your:  Alcohol, tobacco, and drug use.  Emotional well-being.  Home and relationship well-being.  Sexual activity.  Eating habits.  Work and work environment.  Method of birth control.  Menstrual cycle.  Pregnancy history. What immunizations do I need?  Influenza (flu) vaccine  This is recommended every year. Tetanus, diphtheria, and pertussis (Tdap) vaccine  You may need a Td booster every 10 years. Varicella (chickenpox) vaccine  You may need this if you have not been vaccinated. Human papillomavirus (HPV) vaccine  If recommended by your health care provider, you may need three doses over 6 months. Measles, mumps, and rubella (MMR) vaccine  You may need at least one dose of MMR. You may also need a second dose. Meningococcal conjugate (MenACWY) vaccine  One dose is recommended if you are age 19-21 years and a first-year college student living in a residence hall, or if you have one of several medical conditions. You may also need additional booster doses. Pneumococcal conjugate (PCV13) vaccine  You may need  this if you have certain conditions and were not previously vaccinated. Pneumococcal polysaccharide (PPSV23) vaccine  You may need one or two doses if you smoke cigarettes or if you have certain conditions. Hepatitis A vaccine  You may need this if you have certain conditions or if you travel or work in places where you may be exposed to hepatitis A. Hepatitis B vaccine  You may need this if you have certain conditions or if you travel or work in places where you may be exposed to hepatitis B. Haemophilus influenzae type b (Hib) vaccine  You may need this if you have certain conditions. You may receive vaccines as individual doses or as more than one vaccine together in one shot (combination vaccines). Talk with your health care provider about the risks and benefits of combination vaccines. What tests do I need?  Blood tests  Lipid and cholesterol levels. These may be checked every 5 years starting at age 20.  Hepatitis C test.  Hepatitis B test. Screening  Diabetes screening. This is done by checking your blood sugar (glucose) after you have not eaten for a while (fasting).  Sexually transmitted disease (STD) testing.  BRCA-related cancer screening. This may be done if you have a family history of breast, ovarian, tubal, or peritoneal cancers.  Pelvic exam and Pap test. This may be done every 3 years starting at age 21. Starting at age 30, this may be done every 5 years if you have a Pap test in combination with an HPV test. Talk with your health care provider about your test results, treatment options, and if necessary, the need for more tests.   Follow these instructions at home: Eating and drinking   Eat a diet that includes fresh fruits and vegetables, whole grains, lean protein, and low-fat dairy.  Take vitamin and mineral supplements as recommended by your health care provider.  Do not drink alcohol if: ? Your health care provider tells you not to drink. ? You are  pregnant, may be pregnant, or are planning to become pregnant.  If you drink alcohol: ? Limit how much you have to 0-1 drink a day. ? Be aware of how much alcohol is in your drink. In the [place] one drink equals one 12 oz bottle of beer (355 mL), one 5 oz glass of wine (148 mL), or one 1 oz glass of hard liquor (44 mL). Lifestyle  Take daily care of your teeth and gums.  Stay active. Exercise for at least 30 minutes on 5 or more days each week.  Do not use any products that contain nicotine or tobacco, such as cigarettes, e-cigarettes, and chewing tobacco. If you need help quitting, ask your health care provider.  If you are sexually active, practice safe sex. Use a condom or other form of birth control (contraception) in order to prevent pregnancy and STIs (sexually transmitted infections). If you plan to become pregnant, see your health care provider for a preconception visit. What's next?  Visit your health care provider once a year for a well check visit.  Ask your health care provider how often you should have your eyes and teeth checked.  Stay up to date on all vaccines. This information is not intended to replace advice given to you by your health care provider. Make sure you discuss any questions you have with your health care provider. Document Released: 06/30/2001 Document Revised: 01/13/2018 Document Reviewed: 01/13/2018 Elsevier Patient Education  2020 [employer]  

## 2019-01-16 NOTE — Progress Notes (Signed)
GYNECOLOGY ANNUAL PREVENTATIVE CARE ENCOUNTER NOTE  History:     Christine Ball is a 38 y.o. G2P2 female here for a routine annual gynecologic exam.  Current complaints: small bump upper left side that she has had for a few weeks, not painful.   Denies abnormal vaginal bleeding, discharge, pelvic pain, problems with intercourse or other gynecologic concerns.    Gynecologic History Patient's last menstrual period was 12/26/2018 (exact date). Contraception: vasectomy Last Pap: 09/16/2015. Results were: normal  Last mammogram: n/a .   Obstetric History OB History  Gravida Para Term Preterm AB Living  2 2          SAB TAB Ectopic Multiple Live Births               # Outcome Date GA Lbr Len/2nd Weight Sex Delivery Anes PTL Lv  2 Para 2009    F Vag-Spont     1 Para 2007    F Vag-Spont       Past Medical History:  Diagnosis Date  . Anxiety   . Chronic insomnia   . Eczema   . Right hip pain   . Vegetarian diet     No past surgical history on file.  Current Outpatient Medications on File Prior to Visit  Medication Sig Dispense Refill  . ALPRAZolam (XANAX) 0.5 MG tablet Take 1 tablet (0.5 mg total) by mouth at bedtime as needed for anxiety. 30 tablet 1  . doxylamine, Sleep, (UNISOM) 25 MG tablet Take 12.5 mg by mouth every evening.     No current facility-administered medications on file prior to visit.     Allergies  Allergen Reactions  . Penicillins     Social History:  reports that she has never smoked. She has never used smokeless tobacco. She reports current alcohol use. She reports that she does not use drugs.  Exercises walking, [place] , cross country with her kids.  Family History  Problem Relation Age of Onset  . Osteoporosis Mother   . Breast cancer Mother   . Metabolic syndrome Father   . Cancer Paternal Grandfather     The following portions of the patient's history were reviewed and updated as appropriate: allergies, current medications, past family  history, past medical history, past social history, past surgical history and problem list.  Review of Systems Pertinent items noted in HPI and remainder of comprehensive ROS otherwise negative.  Physical Exam:  LMP 12/26/2018 (Exact Date)  CONSTITUTIONAL: Well-developed, well-nourished female in no acute distress.  HENT:  Normocephalic, atraumatic, External right and left ear normal. Oropharynx is clear and moist EYES: Conjunctivae and EOM are normal. Pupils are equal, round, and reactive to light. No scleral icterus.  NECK: Normal range of motion, supple, no masses.  Normal thyroid.  SKIN: Skin is warm and dry. No rash noted. Not diaphoretic. No erythema. No pallor. MUSCULOSKELETAL: Normal range of motion. No tenderness.  No cyanosis, clubbing, or edema.  2+ distal pulses. NEUROLOGIC: Alert and oriented to person, place, and time. Normal reflexes, muscle tone coordination. No cranial nerve deficit noted. PSYCHIATRIC: Normal mood and affect. Normal behavior. Normal judgment and thought content. CARDIOVASCULAR: Normal heart rate noted, regular rhythm RESPIRATORY: Clear to auscultation bilaterally. Effort and breath sounds normal, no problems with respiration noted. BREASTS: Symmetric in size. No masses, skin changes, nipple drainage, or lymphadenopathy. ABDOMEN: Soft, normal bowel sounds, no distention noted.  No tenderness, rebound or guarding.  PELVIC: Normal appearing external genitalia; normal appearing vaginal mucosa and cervix.  No abnormal discharge noted.  Not redness or lesions seen. On palpation small drop size mass felt, mobile, non tender. No redness or swelling noted. Not seen with visualization. Only noted with palpation.   Pap smear obtained. Nabothian cyst noted, contact bleeding Normal uterine size, no other palpable masses, no uterine or adnexal tenderness.   Assessment and Plan:  Annual Well Women GYN Exam  Will follow up results of pap smear and manage  accordingly. Mammogram not indicated Recommend Epson salt baths and warm compresses.   Labs: none due. Follow up with any changes to area.  Routine preventative health maintenance measures emphasized. Please refer to After Visit Summary for other counseling recommendations.      Philip Aspen, CNM

## 2019-01-17 LAB — CYTOLOGY - PAP
Diagnosis: NEGATIVE
HPV: NOT DETECTED

## 2019-06-09 DIAGNOSIS — H5213 Myopia, bilateral: Secondary | ICD-10-CM | POA: Diagnosis not present

## 2019-07-10 DIAGNOSIS — Z03818 Encounter for observation for suspected exposure to other biological agents ruled out: Secondary | ICD-10-CM | POA: Diagnosis not present

## 2019-07-10 DIAGNOSIS — Z20828 Contact with and (suspected) exposure to other viral communicable diseases: Secondary | ICD-10-CM | POA: Diagnosis not present

## 2019-08-07 ENCOUNTER — Encounter: Payer: Self-pay | Admitting: Family Medicine

## 2019-08-07 ENCOUNTER — Other Ambulatory Visit: Payer: Self-pay

## 2019-08-07 ENCOUNTER — Ambulatory Visit (INDEPENDENT_AMBULATORY_CARE_PROVIDER_SITE_OTHER): Payer: 59 | Admitting: Family Medicine

## 2019-08-07 VITALS — BP 112/78 | HR 89 | Temp 97.3°F | Resp 16 | Ht 65.0 in | Wt 144.4 lb

## 2019-08-07 DIAGNOSIS — J3489 Other specified disorders of nose and nasal sinuses: Secondary | ICD-10-CM

## 2019-08-07 DIAGNOSIS — R300 Dysuria: Secondary | ICD-10-CM | POA: Diagnosis not present

## 2019-08-07 LAB — POCT URINALYSIS DIPSTICK
Bilirubin, UA: NEGATIVE
Glucose, UA: NEGATIVE
Ketones, UA: NEGATIVE
Leukocytes, UA: NEGATIVE
Nitrite, UA: NEGATIVE
Protein, UA: NEGATIVE
Spec Grav, UA: 1.01 (ref 1.010–1.025)
Urobilinogen, UA: 0.2 E.U./dL
pH, UA: 5 (ref 5.0–8.0)

## 2019-08-07 MED ORDER — SULFAMETHOXAZOLE-TRIMETHOPRIM 800-160 MG PO TABS: 1.0000 | ORAL_TABLET | Freq: Two times a day (BID) | ORAL | 0 refills | Status: DC

## 2019-08-07 NOTE — Progress Notes (Signed)
Name: Christine Ball   MRN: [id number]    DOB: 05-Oct-1980   Date:08/07/2019       Progress Note  Subjective  Chief Complaint  Chief Complaint  Patient presents with  . Urinary Tract Infection    for 3 days    HPI  Dysuria: she noticed some cloudiness on her urine 3 days ago, now having dysuria, and now persistent discomfort on her urethra/burning feeling. No fever, chills or back pain.   Headache: she states felt tired last week on Friday, used flonase, vitamin D and took iron pills, today she woke up with headache dull like on the frontal area, some facial pressure. No post-nasal drainage, sore throat.    Patient Active Problem List   Diagnosis Date Noted  . Insomnia, persistent 03/14/2015  . GAD (generalized anxiety disorder) 03/14/2015  . Dermatitis, eczematoid 03/14/2015  . Strict vegetarian diet 03/14/2015  . Nonallopathic lesion of sacral region 10/08/2014  . Nonallopathic lesion of lumbosacral region 10/08/2014  . Gluteal tendinitis of right buttock 08/21/2014    No past surgical history on file.  Family History  Problem Relation Age of Onset  . Osteoporosis Mother   . Breast cancer Mother   . Metabolic syndrome Father   . Cancer Paternal Grandfather     Social History   Tobacco Use  . Smoking status: Never Smoker  . Smokeless tobacco: Never Used  Substance Use Topics  . Alcohol use: Yes    Alcohol/week: 0.0 standard drinks     Current Outpatient Medications:  .  doxylamine, Sleep, (UNISOM) 25 MG tablet, Take 12.5 mg by mouth every evening., Disp: , Rfl:  .  fluticasone (FLONASE) 50 MCG/ACT nasal spray, Place into both nostrils daily., Disp: , Rfl:  .  Multiple Vitamin (MULTIVITAMIN) tablet, Take 1 tablet by mouth daily., Disp: , Rfl:  .  Omega-3 Fatty Acids (FISH OIL) 1000 MG CAPS, Take by mouth., Disp: , Rfl:  .  ALPRAZolam (XANAX) 0.5 MG tablet, Take 1 tablet (0.5 mg total) by mouth at bedtime as needed for anxiety. (Patient not taking: Reported on  08/07/2019), Disp: 30 tablet, Rfl: 1  Allergies  Allergen Reactions  . Penicillins     I personally reviewed active problem list, medication list, allergies, family history, social history, health maintenance with the patient/caregiver today.   ROS  Constitutional: Negative for fever or weight change.  Respiratory: Negative for cough and shortness of breath.   Cardiovascular: Negative for chest pain or palpitations.  Gastrointestinal: Positive  for abdominal pain, no bowel changes.  Musculoskeletal: Negative for gait problem or joint swelling.  Skin: Negative for rash.  Neurological: Negative for dizziness, positive  headache.  No other specific complaints in a complete review of systems (except as listed in HPI above).  Objective  Vitals:   08/07/19 1144  BP: 112/78  Pulse: 89  Resp: 16  Temp: (!) 97.3 F (36.3 C)  TempSrc: Temporal  SpO2: 99%  Weight: 144 lb 6.4 oz (65.5 kg)  Height: 5\' 5"  (1.651 m)    Body mass index is 24.03 kg/m.  Physical Exam  Constitutional: Patient appears well-developed and well-nourished. No distress.  HEENT: head atraumatic, normocephalic, pupils equal and reactive to light, ears normal TM, neck supple, throat within normal limits, tender during palpation of maxillary sinus  Cardiovascular: Normal rate, regular rhythm and normal heart sounds.  No murmur heard. No BLE edema. Pulmonary/Chest: Effort normal and breath sounds normal. No respiratory distress. Abdominal: Soft.  There is no tenderness.  Psychiatric: Patient has a normal mood and affect. behavior is normal. Judgment and thought content normal.  Recent Results (from the past 2160 hour(s))  POCT urinalysis dipstick     Status: None   Collection Time: 08/07/19 11:51 AM  Result Value Ref Range   Color, UA yellow    Clarity, UA clear    Glucose, UA Negative Negative   Bilirubin, UA negative    Ketones, UA negative    Spec Grav, UA 1.010 1.010 - 1.025   Blood, UA trace    pH, UA  5.0 5.0 - 8.0   Protein, UA Negative Negative   Urobilinogen, UA 0.2 0.2 or 1.0 E.U./dL   Nitrite, UA negative    Leukocytes, UA Negative Negative   Appearance clear    Odor none      PHQ2/9: Depression screen [facility] 2/9 08/07/2019 12/15/2018 08/06/2017 05/21/2016 12/30/2015  Decreased Interest 0 0 0 0 0  Down, Depressed, Hopeless 0 0 0 0 0  PHQ - 2 Score 0 0 0 0 0  Altered sleeping 0 0 1 - -  Tired, decreased energy 0 0 0 - -  Change in appetite 0 0 0 - -  Feeling bad or failure about yourself  0 0 0 - -  Trouble concentrating 0 0 0 - -  Moving slowly or fidgety/restless 0 0 0 - -  Suicidal thoughts 0 0 0 - -  PHQ-9 Score 0 0 1 - -  Difficult doing work/chores Not difficult at all - Not difficult at all - -    phq 9 is negative   Fall Risk: Fall Risk  08/07/2019 12/15/2018 08/06/2017 08/06/2017 05/21/2016  Falls in the past year? 0 0 No No No  Number falls in past yr: 0 0 - - -  Injury with Fall? 0 0 - - -  Follow up Falls evaluation completed - - - -     Assessment & Plan

## 2019-08-08 LAB — URINE CULTURE
MICRO NUMBER:: 10278265
Result:: NO GROWTH
SPECIMEN QUALITY:: ADEQUATE

## 2019-08-09 ENCOUNTER — Encounter: Payer: Self-pay | Admitting: Family Medicine

## 2019-08-10 ENCOUNTER — Other Ambulatory Visit: Payer: Self-pay | Admitting: Family Medicine

## 2019-08-10 DIAGNOSIS — J3489 Other specified disorders of nose and nasal sinuses: Secondary | ICD-10-CM

## 2019-08-10 MED ORDER — AZITHROMYCIN 500 MG PO TABS: 500.0000 mg | ORAL_TABLET | Freq: Every day | ORAL | 0 refills | Status: DC

## 2019-08-11 DIAGNOSIS — Z03818 Encounter for observation for suspected exposure to other biological agents ruled out: Secondary | ICD-10-CM | POA: Diagnosis not present

## 2019-08-11 DIAGNOSIS — Z20828 Contact with and (suspected) exposure to other viral communicable diseases: Secondary | ICD-10-CM | POA: Diagnosis not present

## 2019-11-15 DIAGNOSIS — L578 Other skin changes due to chronic exposure to nonionizing radiation: Secondary | ICD-10-CM | POA: Diagnosis not present

## 2019-11-15 DIAGNOSIS — Z86018 Personal history of other benign neoplasm: Secondary | ICD-10-CM | POA: Diagnosis not present

## 2019-12-25 ENCOUNTER — Ambulatory Visit (INDEPENDENT_AMBULATORY_CARE_PROVIDER_SITE_OTHER): Payer: Self-pay

## 2019-12-25 ENCOUNTER — Other Ambulatory Visit: Payer: Self-pay

## 2019-12-25 DIAGNOSIS — L680 Hirsutism: Secondary | ICD-10-CM

## 2019-12-25 NOTE — Progress Notes (Signed)
Pt has had a full series of L[facility] at [facility] with me with nice improvement. jj

## 2020-01-17 ENCOUNTER — Encounter: Payer: Self-pay | Admitting: Certified Nurse Midwife

## 2020-01-17 ENCOUNTER — Other Ambulatory Visit: Payer: Self-pay

## 2020-01-17 ENCOUNTER — Ambulatory Visit (INDEPENDENT_AMBULATORY_CARE_PROVIDER_SITE_OTHER): Payer: 59 | Admitting: Certified Nurse Midwife

## 2020-01-17 VITALS — BP 103/69 | HR 65 | Ht 65.0 in | Wt 139.4 lb

## 2020-01-17 DIAGNOSIS — Z01419 Encounter for gynecological examination (general) (routine) without abnormal findings: Secondary | ICD-10-CM | POA: Diagnosis not present

## 2020-01-17 DIAGNOSIS — Z1231 Encounter for screening mammogram for malignant neoplasm of breast: Secondary | ICD-10-CM | POA: Diagnosis not present

## 2020-01-17 NOTE — Progress Notes (Signed)
GYNECOLOGY ANNUAL PREVENTATIVE CARE ENCOUNTER NOTE  History:     TASHEENA WAMBOLT is a 39 y.o. G27P2002 female here for a routine annual gynecologic exam.  Current complaints: has had some breakthrough bleeding, thinks it is related to her daughters cycle. .   Denies abnormal vaginal bleeding, discharge, pelvic pain, problems with intercourse or other gynecologic concerns.     Social Relationship:Married  [place] husband and children Work:Volunteer elementary school reading  Smoke/Alcohol/drug KAJ:[id number]   Gynecologic History Patient's last menstrual period was 12/24/2019 (exact date). Contraception: vasectomy Last Pap: 01/16/2019 Results were: normal with negative HPV Last mammogram: ordered  Obstetric History OB History  Gravida Para Term Preterm AB Living  2 2 2     2   SAB TAB Ectopic Multiple Live Births          2    # Outcome Date GA Lbr Len/2nd Weight Sex Delivery Anes PTL Lv  2 Term 07/21/07   8 lb 10 oz (3.912 kg) M Vag-Spont  N LIV  1 Term 08/13/05   9 lb 3 oz (4.167 kg) F Vag-Spont  N LIV    Past Medical History:  Diagnosis Date  . Anxiety   . Chronic insomnia   . Eczema   . Right hip pain   . Vegetarian diet     Past Surgical History:  Procedure Laterality Date  . WISDOM TOOTH EXTRACTION      Current Outpatient Medications on File Prior to Visit  Medication Sig Dispense Refill  . doxylamine, Sleep, (UNISOM) 25 MG tablet Take 12.5 mg by mouth every evening.    . fluticasone (FLONASE) 50 MCG/ACT nasal spray Place into both nostrils daily.    . Multiple Vitamin (MULTIVITAMIN) tablet Take 1 tablet by mouth daily.    . Omega-3 Fatty Acids (FISH OIL) 1000 MG CAPS Take by mouth.    . ALPRAZolam (XANAX) 0.5 MG tablet Take 1 tablet (0.5 mg total) by mouth at bedtime as needed for anxiety. (Patient not taking: Reported on 08/07/2019) 30 tablet 1  . azithromycin (ZITHROMAX) 500 MG tablet Take 1 tablet (500 mg total) by mouth daily. (Patient not taking:  Reported on 01/17/2020) 3 tablet 0   No current facility-administered medications on file prior to visit.    Allergies  Allergen Reactions  . Penicillins   . Sulfa Antibiotics Hives    Social History:  reports that she has never smoked. She has never used smokeless tobacco. She reports current alcohol use of about 1.0 standard drink of alcohol per week. She reports that she does not use drugs.  Family History  Problem Relation Age of Onset  . Osteoporosis Mother   . Breast cancer Mother   . Metabolic syndrome Father   . Cancer Paternal Grandfather     The following portions of the patient's history were reviewed and updated as appropriate: allergies, current medications, past family history, past medical history, past social history, past surgical history and problem list.  Review of Systems Pertinent items noted in HPI and remainder of comprehensive ROS otherwise negative.  Physical Exam:  BP 103/69   Pulse 65   Ht 5\' 5"  (1.651 m)   Wt 139 lb 6.4 oz (63.2 kg)   LMP 12/24/2019 (Exact Date)   BMI 23.20 kg/m  CONSTITUTIONAL: Well-developed, well-nourished female in no acute distress.  HENT:  Normocephalic, atraumatic, External right and left ear normal. Oropharynx is clear and moist EYES: Conjunctivae and EOM are normal. Pupils are equal, round, and reactive  to light. No scleral icterus.  NECK: Normal range of motion, supple, no masses.  Normal thyroid.  SKIN: Skin is warm and dry. No rash noted. Not diaphoretic. No erythema. No pallor. MUSCULOSKELETAL: Normal range of motion. No tenderness.  No cyanosis, clubbing, or edema.  2+ distal pulses. NEUROLOGIC: Alert and oriented to person, place, and time. Normal reflexes, muscle tone coordination.  PSYCHIATRIC: Normal mood and affect. Normal behavior. Normal judgment and thought content. CARDIOVASCULAR: Normal heart rate noted, regular rhythm RESPIRATORY: Clear to auscultation bilaterally. Effort and breath sounds normal, no  problems with respiration noted. BREASTS: Symmetric in size. No masses, tenderness, skin changes, nipple drainage, or lymphadenopathy bilaterally.  ABDOMEN: Soft, no distention noted.  No tenderness, rebound or guarding.  PELVIC: Normal appearing external genitalia and urethral meatus; normal appearing vaginal mucosa and cervix.  No abnormal discharge noted.  Pap smear not indicated, Nabothian cysts noted   Normal uterine size, no other palpable masses, no uterine or adnexal tenderness.  .   Assessment and Plan:    1. Women's annual routine gynecological examination   Will follow up results of pap smear and manage accordingly. Pap:not due until 2025 Mammogram :ordered Labs:Has done at PCP Refills: none Referral:none Discussed use of BC-pill to help regulate cycle if interested. Disucssed Lo loestrin information given. Dirscussed Vitamin D and calcium recommendations. Pt states she take gummie vitamin daily.  Routine preventative health maintenance measures emphasized. Please refer to After Visit Summary for other counseling recommendations.      Philip Aspen, CNM [facility]

## 2020-01-17 NOTE — Patient Instructions (Signed)

## 2020-02-01 ENCOUNTER — Ambulatory Visit
Admission: RE | Admit: 2020-02-01 | Discharge: 2020-02-01 | Disposition: A | Discharge status: Home / Self Care | Payer: 59 | Source: Ambulatory Visit | Attending: Certified Nurse Midwife | Admitting: Certified Nurse Midwife

## 2020-02-01 ENCOUNTER — Other Ambulatory Visit: Payer: Self-pay

## 2020-02-01 DIAGNOSIS — Z01419 Encounter for gynecological examination (general) (routine) without abnormal findings: Secondary | ICD-10-CM

## 2020-02-01 DIAGNOSIS — Z1231 Encounter for screening mammogram for malignant neoplasm of breast: Secondary | ICD-10-CM | POA: Diagnosis not present

## 2020-02-06 ENCOUNTER — Telehealth: Payer: Self-pay

## 2020-02-06 NOTE — Telephone Encounter (Signed)
mychart message sent to patient- normal mammogram results

## 2020-02-19 ENCOUNTER — Other Ambulatory Visit: Payer: Self-pay

## 2020-02-19 ENCOUNTER — Ambulatory Visit: Payer: Self-pay

## 2020-02-19 DIAGNOSIS — L680 Hirsutism: Secondary | ICD-10-CM

## 2020-09-16 NOTE — Patient Instructions (Signed)
Preventive Care 84-41 Years Old, Female Preventive care refers to lifestyle choices and visits with your health care provider that can promote health and wellness. This includes:  A yearly physical exam. This is also called an annual wellness visit.  Regular dental and eye exams.  Immunizations.  Screening for certain conditions.  Healthy lifestyle choices, such as: ? Eating a healthy diet. ? Getting regular exercise. ? Not using drugs or products that contain nicotine and tobacco. ? Limiting alcohol use. What can I expect for my preventive care visit? Physical exam Your health care provider will check your:  Height and weight. These may be used to calculate your BMI (body mass index). BMI is a measurement that tells if you are at a healthy weight.  Heart rate and blood pressure.  Body temperature.  Skin for abnormal spots. Counseling Your health care provider may ask you questions about your:  Past medical problems.  Family's medical history.  Alcohol, tobacco, and drug use.  Emotional well-being.  Home life and relationship well-being.  Sexual activity.  Diet, exercise, and sleep habits.  Work and work Statistician.  Access to firearms.  Method of birth control.  Menstrual cycle.  Pregnancy history. What immunizations do I need? Vaccines are usually given at various ages, according to a schedule. Your health care provider will recommend vaccines for you based on your age, medical history, and lifestyle or other factors, such as travel or where you work.   What tests do I need? Blood tests  Lipid and cholesterol levels. These may be checked every 5 years, or more often if you are over 3 years old.  Hepatitis C test.  Hepatitis B test. Screening  Lung cancer screening. You may have this screening every year starting at age 73 if you have a 30-pack-year history of smoking and currently smoke or have quit within the past 15 years.  Colorectal cancer  screening. ? All adults should have this screening starting at age 52 and continuing until age 17. ? Your health care provider may recommend screening at age 49 if you are at increased risk. ? You will have tests every 1-10 years, depending on your results and the type of screening test.  Diabetes screening. ? This is done by checking your blood sugar (glucose) after you have not eaten for a while (fasting). ? You may have this done every 1-3 years.  Mammogram. ? This may be done every 1-2 years. ? Talk with your health care provider about when you should start having regular mammograms. This may depend on whether you have a family history of breast cancer.  BRCA-related cancer screening. This may be done if you have a family history of breast, ovarian, tubal, or peritoneal cancers.  Pelvic exam and Pap test. ? This may be done every 3 years starting at age 10. ? Starting at age 11, this may be done every 5 years if you have a Pap test in combination with an HPV test. Other tests  STD (sexually transmitted disease) testing, if you are at risk.  Bone density scan. This is done to screen for osteoporosis. You may have this scan if you are at high risk for osteoporosis. Talk with your health care provider about your test results, treatment options, and if necessary, the need for more tests. Follow these instructions at home: Eating and drinking  Eat a diet that includes fresh fruits and vegetables, whole grains, lean protein, and low-fat dairy products.  Take vitamin and mineral supplements  as recommended by your health care provider.  Do not drink alcohol if: ? Your health care provider tells you not to drink. ? You are pregnant, may be pregnant, or are planning to become pregnant.  If you drink alcohol: ? Limit how much you have to 0-1 drink a day. ? Be aware of how much alcohol is in your drink. In the [place] one drink equals one 12 oz bottle of beer (355 mL), one 5 oz glass of  wine (148 mL), or one 1 oz glass of hard liquor (44 mL).   Lifestyle  Take daily care of your teeth and gums. Brush your teeth every morning and night with fluoride toothpaste. Floss one time each day.  Stay active. Exercise for at least 30 minutes 5 or more days each week.  Do not use any products that contain nicotine or tobacco, such as cigarettes, e-cigarettes, and chewing tobacco. If you need help quitting, ask your health care provider.  Do not use drugs.  If you are sexually active, practice safe sex. Use a condom or other form of protection to prevent STIs (sexually transmitted infections).  If you do not wish to become pregnant, use a form of birth control. If you plan to become pregnant, see your health care provider for a prepregnancy visit.  If told by your health care provider, take low-dose aspirin daily starting at age 37.  Find healthy ways to cope with stress, such as: ? Meditation, yoga, or listening to music. ? Journaling. ? Talking to a trusted person. ? Spending time with friends and family. Safety  Always wear your seat belt while driving or riding in a vehicle.  Do not drive: ? If you have been drinking alcohol. Do not ride with someone who has been drinking. ? When you are tired or distracted. ? While texting.  Wear a helmet and other protective equipment during sports activities.  If you have firearms in your house, make sure you follow all gun safety procedures. What's next?  Visit your health care provider once a year for an annual wellness visit.  Ask your health care provider how often you should have your eyes and teeth checked.  Stay up to date on all vaccines. This information is not intended to replace advice given to you by your health care provider. Make sure you discuss any questions you have with your health care provider. Document Revised: 02/06/2020 Document Reviewed: 01/13/2018 Elsevier Patient Education  2021 [employer].

## 2020-09-16 NOTE — Progress Notes (Signed)
Name: Christine Ball   MRN: [id number]    DOB: [birth year]   Date:09/17/2020       Progress Note  Subjective  Chief Complaint  Annual Exam  HPI  Patient presents for annual CPE.  She will be going to [place] and would like something to take while there to help her sleep, she did well with alprazolam in the past   Diet: balanced , eats some chicken and [place]  Exercise: running more often   [place] Visit from 08/07/2019 in [facility]  AUDIT-C Score 1     Depression: Phq 9 is  positive Depression screen [facility] 2/9 09/17/2020 08/07/2019 12/15/2018 08/06/2017 05/21/2016  Decreased Interest 0 0 0 0 0  Down, Depressed, Hopeless 1 0 0 0 0  PHQ - 2 Score 1 0 0 0 0  Altered sleeping 0 0 0 1 -  Tired, decreased energy 0 0 0 0 -  Change in appetite 0 0 0 0 -  Feeling bad or failure about yourself  0 0 0 0 -  Trouble concentrating 0 0 0 0 -  Moving slowly or fidgety/restless 0 0 0 0 -  Suicidal thoughts 0 0 0 0 -  PHQ-9 Score 1 0 0 1 -  Difficult doing work/chores - Not difficult at all - Not difficult at all -   Hypertension: BP Readings from Last 3 Encounters:  09/17/20 120/76  01/17/20 103/69  08/07/19 112/78   Obesity: Wt Readings from Last 3 Encounters:  09/17/20 142 lb (64.4 kg)  01/17/20 139 lb 6.4 oz (63.2 kg)  08/07/19 144 lb 6.4 oz (65.5 kg)   BMI Readings from Last 3 Encounters:  09/17/20 23.63 kg/m  01/17/20 23.20 kg/m  08/07/19 24.03 kg/m     Vaccines:   Pneumonia: educated and discussed with patient. Flu: educated and discussed with patient.  Hep C Screening: Ordered today STD testing and prevention (HIV/chl/gon/syphilis): N/A Intimate partner violence: negative Sexual History : no problem  Menstrual History/LMP/Abnormal Bleeding: cycles are getting heavier and closer together, about 24 days apart , lasts about 5 days  Incontinence Symptoms:   Breast cancer:  - Last Mammogram: first one was 01/2020  - BRCA gene screening:  N/A  Osteoporosis: Discussed high calcium and vitamin D supplementation, weight bearing exercises  Cervical cancer screening: 01/16/19  Skin cancer: Discussed monitoring for atypical lesions  Lung cancer:  Low Dose CT Chest recommended if Age 57-80 years, 20 pack-year currently smoking OR have quit w/in 15years. Patient does not qualify.   ECG: N/A  Advanced Care Planning: A voluntary discussion about advance care planning including the explanation and discussion of advance directives.  Discussed health care proxy and Living will, and the patient was able to identify a health care proxy as husband      Lipids: Lab Results  Component Value Date   CHOL 169 11/16/2018   CHOL 161 03/20/2015   CHOL 111 10/03/2012   Lab Results  Component Value Date   HDL 54 11/16/2018   HDL 48 03/20/2015   HDL 44 10/03/2012   Lab Results  Component Value Date   LDLCALC 96 11/16/2018   [place] 95 03/20/2015   LDLCALC 59 10/03/2012   Lab Results  Component Value Date   TRIG 95 11/16/2018   TRIG 91 03/20/2015   TRIG 40 10/03/2012   Lab Results  Component Value Date   CHOLHDL 3.1 11/16/2018   CHOLHDL 3.4 03/20/2015   No results found for: LDLDIRECT  Glucose: Glucose  Date Value Ref Range Status  11/16/2018 90 65 - 99 mg/dL Final  03/20/2015 87 65 - 99 mg/dL Final    Patient Active Problem List   Diagnosis Date Noted  . Insomnia, persistent 03/14/2015  . GAD (generalized anxiety disorder) 03/14/2015  . Dermatitis, eczematoid 03/14/2015  . Strict vegetarian diet 03/14/2015  . Nonallopathic lesion of lumbosacral region 10/08/2014  . Gluteal tendinitis of right buttock 08/21/2014    Past Surgical History:  Procedure Laterality Date  . WISDOM TOOTH EXTRACTION      Family History  Problem Relation Age of Onset  . Osteoporosis Mother   . Breast cancer Mother 78  . Metabolic syndrome Father   . Cancer Paternal Grandfather     Social History   Socioeconomic History  .  Marital status: Married    Spouse name: Jaci Standard   . Number of children: 2  . Years of education: Not on file  . Highest education level: Bachelor's degree (e.g., BA, AB, BS)  Occupational History  . Occupation: stay at home mother   Tobacco Use  . Smoking status: Never Smoker  . Smokeless tobacco: Never Used  Vaping Use  . Vaping Use: Never used  Substance and Sexual Activity  . Alcohol use: Yes    Alcohol/week: 1.0 standard drink    Types: 1 Standard drinks or equivalent per week    Comment: socially  . Drug use: No  . Sexual activity: Yes    Partners: Male    Birth control/protection: None    Comment: husband- vasectomy  Other Topics Concern  . Not on file  Social History Narrative  . Not on file   Social Determinants of Health   Financial Resource Strain: Low Risk   . Difficulty of Paying Living Expenses: Not hard at all  Food Insecurity: No Food Insecurity  . Worried About Charity fundraiser in the Last Year: Never true  . Ran Out of Food in the Last Year: Never true  Transportation Needs: No Transportation Needs  . Lack of Transportation (Medical): No  . Lack of Transportation (Non-Medical): No  Physical Activity: Insufficiently Active  . Days of Exercise per Week: 3 days  . Minutes of Exercise per Session: 30 min  Stress: Stress Concern Present  . Feeling of Stress : To some extent  Social Connections: Socially Integrated  . Frequency of Communication with Friends and Family: More than three times a week  . Frequency of Social Gatherings with Friends and Family: Three times a week  . Attends Religious Services: More than 4 times per year  . Active Member of Clubs or Organizations: Yes  . Attends Archivist Meetings: More than 4 times per year  . Marital Status: Married  Human resources officer Violence: Not At Risk  . Fear of Current or Ex-Partner: No  . Emotionally Abused: No  . Physically Abused: No  . Sexually Abused: No     Current Outpatient  Medications:  .  cholecalciferol (VITAMIN D3) 25 MCG (1000 UNIT) tablet, Take 1,000 Units by mouth daily., Disp: , Rfl:  .  doxylamine, Sleep, (UNISOM) 25 MG tablet, Take 12.5 mg by mouth every evening., Disp: , Rfl:  .  Ferrous Sulfate (IRON PO), Take by mouth., Disp: , Rfl:  .  Multiple Vitamin (MULTIVITAMIN) tablet, Take 1 tablet by mouth daily., Disp: , Rfl:  .  Omega-3 Fatty Acids (FISH OIL) 1000 MG CAPS, Take by mouth., Disp: , Rfl:  .  psyllium (METAMUCIL)  58.6 % packet, Take 1 packet by mouth daily., Disp: , Rfl:  .  fluticasone (FLONASE) 50 MCG/ACT nasal spray, Place into both nostrils daily. (Patient not taking: Reported on 09/17/2020), Disp: , Rfl:   Allergies  Allergen Reactions  . Penicillins   . Sulfa Antibiotics Hives     ROS  Constitutional: Negative for fever or weight change.  Respiratory: Negative for cough and shortness of breath.   Cardiovascular: Negative for chest pain or palpitations.  Gastrointestinal: Negative for abdominal pain, no bowel changes.  Musculoskeletal: Negative for gait problem or joint swelling.  Skin: Negative for rash.  Neurological: Negative for dizziness or headache.  No other specific complaints in a complete review of systems (except as listed in HPI above).  Objective  Vitals:   09/17/20 0956  BP: 120/76  Pulse: 74  Resp: 16  Temp: 98.3 F (36.8 C)  TempSrc: Oral  SpO2: 99%  Weight: 142 lb (64.4 kg)  Height: '5\' 5"'  (1.651 m)    Body mass index is 23.63 kg/m.  Physical Exam  Constitutional: Patient appears well-developed and well-nourished. No distress.  HENT: Head: Normocephalic and atraumatic. Ears: B TMs ok, no erythema or effusion; Nose: Not done. Mouth/Throat: not done  Eyes: Conjunctivae and EOM are normal. Pupils are equal, round, and reactive to light. No scleral icterus.  Neck: Normal range of motion. Neck supple. No JVD present. No thyromegaly present.  Cardiovascular: Normal rate, regular rhythm and normal heart  sounds.  No murmur heard. No BLE edema. Pulmonary/Chest: Effort normal and breath sounds normal. No respiratory distress. Abdominal: Soft. Bowel sounds are normal, no distension. There is no tenderness. no masses Breast: no lumps or masses, no nipple discharge or rashes FEMALE GENITALIA:  Not done  RECTAL: not done  Musculoskeletal: Normal range of motion, no joint effusions. No gross deformities Neurological: he is alert and oriented to person, place, and time. No cranial nerve deficit. Coordination, balance, strength, speech and gait are normal.  Skin: Skin is warm and dry. No rash noted. No erythema.  Psychiatric: Patient has a normal mood and affect. behavior is normal. Judgment and thought content normal.  Fall Risk: Fall Risk  09/17/2020 08/07/2019 12/15/2018 08/06/2017 08/06/2017  Falls in the past year? 0 0 0 No No  Number falls in past yr: 0 0 0 - -  Injury with Fall? 0 0 0 - -  Follow up - Falls evaluation completed - - -    Functional Status Survey: Is the patient deaf or have difficulty hearing?: No Does the patient have difficulty seeing, even when wearing glasses/contacts?: No Does the patient have difficulty concentrating, remembering, or making decisions?: No Does the patient have difficulty walking or climbing stairs?: No Does the patient have difficulty dressing or bathing?: No Does the patient have difficulty doing errands alone such as visiting a doctor's office or shopping?: No   Assessment & Plan  1. Well adult exam  - CBC with Differential/Platelet - COMPLETE METABOLIC PANEL WITH GFR - Lipid panel - Hemoglobin A1c - TSH  2. Need for Tdap vaccination  - Tdap vaccine greater than or equal to 7yo IM  3. Need for hepatitis C screening test  - Hepatitis C Antibody  4. Lipid screening  - Lipid panel  5. Diabetes mellitus screening  - Hemoglobin A1c  6. Screening for deficiency anemia  - CBC with Differential/Platelet  7. Menorrhagia with regular  cycle  - TSH  8. Insomnia, unspecified type  - ALPRAZolam (XANAX) 0.5 MG  tablet; Take 1 tablet (0.5 mg total) by mouth at bedtime as needed for anxiety.  Dispense: 10 tablet; Refill: 0  -USPSTF grade A and B recommendations reviewed with patient; age-appropriate recommendations, preventive care, screening tests, etc discussed and encouraged; healthy living encouraged; see AVS for patient education given to patient -Discussed importance of 150 minutes of physical activity weekly, eat two servings of fish weekly, eat one serving of tree nuts ( cashews, pistachios, pecans, almonds.Marland Kitchen) every other day, eat 6 servings of fruit/vegetables daily and drink plenty of water and avoid sweet beverages.

## 2020-09-17 ENCOUNTER — Other Ambulatory Visit: Payer: Self-pay

## 2020-09-17 ENCOUNTER — Encounter: Payer: Self-pay | Admitting: Family Medicine

## 2020-09-17 ENCOUNTER — Ambulatory Visit (INDEPENDENT_AMBULATORY_CARE_PROVIDER_SITE_OTHER): Payer: 59 | Admitting: Family Medicine

## 2020-09-17 VITALS — BP 120/76 | HR 74 | Temp 98.3°F | Resp 16 | Ht 65.0 in | Wt 142.0 lb

## 2020-09-17 DIAGNOSIS — Z1159 Encounter for screening for other viral diseases: Secondary | ICD-10-CM | POA: Diagnosis not present

## 2020-09-17 DIAGNOSIS — Z13220 Encounter for screening for lipoid disorders: Secondary | ICD-10-CM

## 2020-09-17 DIAGNOSIS — G4700 Insomnia, unspecified: Secondary | ICD-10-CM | POA: Diagnosis not present

## 2020-09-17 DIAGNOSIS — Z23 Encounter for immunization: Secondary | ICD-10-CM | POA: Diagnosis not present

## 2020-09-17 DIAGNOSIS — Z131 Encounter for screening for diabetes mellitus: Secondary | ICD-10-CM

## 2020-09-17 DIAGNOSIS — Z130 Encounter for screening for diseases of the blood and blood-forming organs and certain disorders involving the immune mechanism: Secondary | ICD-10-CM

## 2020-09-17 DIAGNOSIS — Z0000 Encounter for general adult medical examination without abnormal findings: Secondary | ICD-10-CM | POA: Diagnosis not present

## 2020-09-17 DIAGNOSIS — N920 Excessive and frequent menstruation with regular cycle: Secondary | ICD-10-CM | POA: Diagnosis not present

## 2020-09-17 MED ORDER — ALPRAZOLAM 0.5 MG PO TABS
0.5000 mg | ORAL_TABLET | Freq: Every evening | ORAL | 0 refills | Status: DC | PRN
  Filled 2020-09-17: qty 10, 10d supply, fill #0

## 2020-09-18 LAB — LIPID PANEL
Cholesterol: 165 mg/dL (ref <200)
HDL: 55 mg/dL (ref >=50)
LDL Cholesterol (Calc): 91 mg/dL (calc)
Non-HDL Cholesterol (Calc): 110 mg/dL (calc) (ref <130)
Total CHOL/HDL Ratio: 3 (calc) (ref <5.0)
Triglycerides: 91 mg/dL (ref <150)

## 2020-09-18 LAB — COMPLETE METABOLIC PANEL WITH GFR
AG Ratio: 2.2 (calc) (ref 1.0–2.5)
ALT: 21 U/L (ref 6–29)
AST: 17 U/L (ref 10–30)
Albumin: 4.6 g/dL (ref 3.6–5.1)
Alkaline phosphatase (APISO): 58 U/L (ref 31–125)
BUN: 15 mg/dL (ref 7–25)
CO2: 28 mmol/L (ref 20–32)
Calcium: 9.6 mg/dL (ref 8.6–10.2)
Chloride: 105 mmol/L (ref 98–110)
Creat: 0.88 mg/dL (ref 0.50–1.10)
GFR, Est African American: 95 mL/min/{1.73_m2} (ref >=60)
GFR, Est Non African American: 82 mL/min/{1.73_m2} (ref >=60)
Globulin: 2.1 g/dL (calc) (ref 1.9–3.7)
Glucose, Bld: 83 mg/dL (ref 65–99)
Potassium: 4.9 mmol/L (ref 3.5–5.3)
Sodium: 140 mmol/L (ref 135–146)
Total Bilirubin: 0.7 mg/dL (ref 0.2–1.2)
Total Protein: 6.7 g/dL (ref 6.1–8.1)

## 2020-09-18 LAB — CBC WITH DIFFERENTIAL/PLATELET
Absolute Monocytes: 761 cells/uL (ref 200–950)
Basophils Absolute: 41 cells/uL (ref 0–200)
Basophils Relative: 0.5 %
Eosinophils Absolute: 89 cells/uL (ref 15–500)
Eosinophils Relative: 1.1 %
HCT: 44.4 % (ref 35.0–45.0)
Hemoglobin: 14.6 g/dL (ref 11.7–15.5)
Lymphs Abs: 2082 cells/uL (ref 850–3900)
MCH: 31 pg (ref 27.0–33.0)
MCHC: 32.9 g/dL (ref 32.0–36.0)
MCV: 94.3 fL (ref 80.0–100.0)
MPV: 10.5 fL (ref 7.5–12.5)
Monocytes Relative: 9.4 %
Neutro Abs: 5127 cells/uL (ref 1500–7800)
Neutrophils Relative %: 63.3 %
Platelets: 268 10*3/uL (ref 140–400)
RBC: 4.71 10*6/uL (ref 3.80–5.10)
RDW: 11.8 % (ref 11.0–15.0)
Total Lymphocyte: 25.7 %
WBC: 8.1 10*3/uL (ref 3.8–10.8)

## 2020-09-18 LAB — HEPATITIS C ANTIBODY
Hepatitis C Ab: NONREACTIVE
SIGNAL TO CUT-OFF: 0 (ref <1.00)

## 2020-09-18 LAB — TSH: TSH: 2.79 mIU/L

## 2020-09-18 LAB — HEMOGLOBIN A1C
Hgb A1c MFr Bld: 4.9 % of total Hgb (ref <5.7)
Mean Plasma Glucose: 94 mg/dL
eAG (mmol/L): 5.2 mmol/L

## 2020-11-06 DIAGNOSIS — Z20822 Contact with and (suspected) exposure to covid-19: Secondary | ICD-10-CM | POA: Diagnosis not present

## 2021-01-08 DIAGNOSIS — D225 Melanocytic nevi of trunk: Secondary | ICD-10-CM | POA: Diagnosis not present

## 2021-01-08 DIAGNOSIS — L578 Other skin changes due to chronic exposure to nonionizing radiation: Secondary | ICD-10-CM | POA: Diagnosis not present

## 2021-01-08 DIAGNOSIS — Z808 Family history of malignant neoplasm of other organs or systems: Secondary | ICD-10-CM | POA: Diagnosis not present

## 2021-01-21 DIAGNOSIS — H5213 Myopia, bilateral: Secondary | ICD-10-CM | POA: Diagnosis not present

## 2021-01-27 ENCOUNTER — Encounter: Payer: 59 | Admitting: Certified Nurse Midwife

## 2021-02-06 ENCOUNTER — Other Ambulatory Visit: Payer: Self-pay | Admitting: Family Medicine

## 2021-02-06 DIAGNOSIS — Z1231 Encounter for screening mammogram for malignant neoplasm of breast: Secondary | ICD-10-CM

## 2021-02-20 ENCOUNTER — Other Ambulatory Visit: Payer: Self-pay

## 2021-02-20 ENCOUNTER — Ambulatory Visit
Admission: RE | Admit: 2021-02-20 | Discharge: 2021-02-20 | Disposition: A | Discharge status: Home / Self Care | Payer: 59 | Source: Ambulatory Visit | Attending: Family Medicine | Admitting: Family Medicine

## 2021-02-20 DIAGNOSIS — Z1231 Encounter for screening mammogram for malignant neoplasm of breast: Secondary | ICD-10-CM | POA: Diagnosis not present

## 2021-02-27 ENCOUNTER — Encounter: Payer: Self-pay | Admitting: Family Medicine

## 2021-02-28 ENCOUNTER — Ambulatory Visit: Payer: 59 | Admitting: Nurse Practitioner

## 2021-02-28 ENCOUNTER — Other Ambulatory Visit: Payer: Self-pay

## 2021-02-28 ENCOUNTER — Encounter: Payer: Self-pay | Admitting: Nurse Practitioner

## 2021-02-28 VITALS — BP 118/72 | HR 97 | Temp 98.2°F | Resp 16 | Ht 65.0 in | Wt 141.5 lb

## 2021-02-28 DIAGNOSIS — M79662 Pain in left lower leg: Secondary | ICD-10-CM

## 2021-02-28 DIAGNOSIS — Z23 Encounter for immunization: Secondary | ICD-10-CM

## 2021-02-28 NOTE — Progress Notes (Signed)
BP 118/72   Pulse 97   Temp 98.2 F (36.8 C) (Oral)   Resp 16   Ht 5\' 5"  (1.651 m)   Wt 141 lb 8 oz (64.2 kg)   SpO2 99%   BMI 23.55 kg/m    Subjective:    Patient ID: MADLINE OESTERLING, female    DOB: April 05, 1981, 40 y.o.   MRN: [id number]  HPI: Christine Ball is a 40 y.o. female, here alone  Chief Complaint  Patient presents with   Leg Pain    Left calf pain since yesterday while running, felt a pop   Left calf pain:  She says yesterday she was running, felt a pain in the back of her left calf, kept running hoping it would go away and then she felt a pop.  She says the pain is a sharp pain deep in her calf. She is able to ambulate with little pain if she is flat footed.  She has good ROM of her ankle but pain is increased with flexed toes.  No swelling noted, good PMS of left foot.  Discussed rest, ice, compression elevation.  She is currently taking ibuprofen for pain and will continue to do so. Will get MRI.   Relevant past medical, surgical, family and social history reviewed and updated as indicated. Interim medical history since our last visit reviewed. Allergies and medications reviewed and updated.  Review of Systems  Constitutional: Negative for fever or weight change.  Respiratory: Negative for cough and shortness of breath.   Cardiovascular: Negative for chest pain or palpitations.  Gastrointestinal: Negative for abdominal pain, no bowel changes.  Musculoskeletal: Negative for gait problem or joint swelling. Positiv efor left calf pain Skin: Negative for rash.  Neurological: Negative for dizziness or headache.  No other specific complaints in a complete review of systems (except as listed in HPI above).      Objective:    BP 118/72   Pulse 97   Temp 98.2 F (36.8 C) (Oral)   Resp 16   Ht 5\' 5"  (1.651 m)   Wt 141 lb 8 oz (64.2 kg)   SpO2 99%   BMI 23.55 kg/m   Wt Readings from Last 3 Encounters:  02/28/21 141 lb 8 oz (64.2 kg)  09/17/20 142 lb  (64.4 kg)  01/17/20 139 lb 6.4 oz (63.2 kg)    Physical Exam  Constitutional: Patient appears well-developed and well-nourished. No distress.  HEENT: head atraumatic, normocephalic, pupils equal and reactive to light , neck supple Cardiovascular: Normal rate, regular rhythm and normal heart sounds.  No murmur heard. No BLE edema. Pulmonary/Chest: Effort normal and breath sounds normal. No respiratory distress. Abdominal: Soft.  There is no tenderness. Musculoskeletal;  Left lower calf tenderness, no swelling Psychiatric: Patient has a normal mood and affect. behavior is normal. Judgment and thought content normal.   Results for orders placed or performed in visit on 09/17/20  Hepatitis C Antibody  Result Value Ref Range   Hepatitis C Ab NON-REACTIVE NON-REACTIVE   SIGNAL TO CUT-OFF 0.00 <1.00  CBC with Differential/Platelet  Result Value Ref Range   WBC 8.1 3.8 - 10.8 Thousand/uL   RBC 4.71 3.80 - 5.10 Million/uL   Hemoglobin 14.6 11.7 - 15.5 g/dL   HCT 44.4 35.0 - 45.0 %   MCV 94.3 80.0 - 100.0 fL   MCH 31.0 27.0 - 33.0 pg   MCHC 32.9 32.0 - 36.0 g/dL   RDW 11.8 11.0 - 15.0 %  Platelets 268 140 - 400 Thousand/uL   MPV 10.5 7.5 - 12.5 fL   Neutro Abs 5,127 1,500 - 7,800 cells/uL   Lymphs Abs 2,082 850 - 3,900 cells/uL   Absolute Monocytes 761 200 - 950 cells/uL   Eosinophils Absolute 89 15 - 500 cells/uL   Basophils Absolute 41 0 - 200 cells/uL   Neutrophils Relative % 63.3 %   Total Lymphocyte 25.7 %   Monocytes Relative 9.4 %   Eosinophils Relative 1.1 %   Basophils Relative 0.5 %  COMPLETE METABOLIC PANEL WITH GFR  Result Value Ref Range   Glucose, Bld 83 65 - 99 mg/dL   BUN 15 7 - 25 mg/dL   Creat 0.88 0.50 - 1.10 mg/dL   GFR, Est Non African American 82 > OR = 60 mL/min/1.60m2   GFR, Est African American 95 > OR = 60 mL/min/1.23m2   BUN/Creatinine Ratio NOT APPLICABLE 6 - 22 (calc)   Sodium 140 135 - 146 mmol/L   Potassium 4.9 3.5 - 5.3 mmol/L   Chloride 105 98  - 110 mmol/L   CO2 28 20 - 32 mmol/L   Calcium 9.6 8.6 - 10.2 mg/dL   Total Protein 6.7 6.1 - 8.1 g/dL   Albumin 4.6 3.6 - 5.1 g/dL   Globulin 2.1 1.9 - 3.7 g/dL (calc)   AG Ratio 2.2 1.0 - 2.5 (calc)   Total Bilirubin 0.7 0.2 - 1.2 mg/dL   Alkaline phosphatase (APISO) 58 31 - 125 U/L   AST 17 10 - 30 U/L   ALT 21 6 - 29 U/L  Lipid panel  Result Value Ref Range   Cholesterol 165 <200 mg/dL   HDL 55 > OR = 50 mg/dL   Triglycerides 91 <150 mg/dL   LDL Cholesterol (Calc) 91 mg/dL (calc)   Total CHOL/HDL Ratio 3.0 <5.0 (calc)   Non-HDL Cholesterol (Calc) 110 <130 mg/dL (calc)  Hemoglobin A1c  Result Value Ref Range   Hgb A1c MFr Bld 4.9 <5.7 % of total Hgb   Mean Plasma Glucose 94 mg/dL   eAG (mmol/L) 5.2 mmol/L  TSH  Result Value Ref Range   TSH 2.79 mIU/L      Assessment & Plan:   1. Pain of left calf - rest, ice, compression and elevation - MR TIBIA FIBULA LEFT WO CONTRAST; Future  2. Need for influenza vaccination  - Flu Vaccine QUAD 6+ mos PF IM (Fluarix Quad PF)   Follow up plan: Return if symptoms worsen or fail to improve.

## 2021-03-04 ENCOUNTER — Other Ambulatory Visit: Payer: Self-pay

## 2021-03-04 ENCOUNTER — Ambulatory Visit
Admission: RE | Admit: 2021-03-04 | Discharge: 2021-03-04 | Disposition: A | Discharge status: Home / Self Care | Payer: 59 | Source: Ambulatory Visit | Attending: Nurse Practitioner | Admitting: Nurse Practitioner

## 2021-03-04 DIAGNOSIS — M79662 Pain in left lower leg: Secondary | ICD-10-CM | POA: Insufficient documentation

## 2021-03-04 DIAGNOSIS — S86812A Strain of other muscle(s) and tendon(s) at lower leg level, left leg, initial encounter: Secondary | ICD-10-CM | POA: Diagnosis not present

## 2021-03-05 ENCOUNTER — Other Ambulatory Visit: Payer: Self-pay | Admitting: Nurse Practitioner

## 2021-03-05 DIAGNOSIS — M79662 Pain in left lower leg: Secondary | ICD-10-CM

## 2021-03-11 ENCOUNTER — Ambulatory Visit: Payer: 59 | Admitting: Family Medicine

## 2021-03-11 ENCOUNTER — Other Ambulatory Visit: Payer: Self-pay

## 2021-03-11 ENCOUNTER — Encounter: Payer: Self-pay | Admitting: Family Medicine

## 2021-03-11 VITALS — BP 98/74 | HR 80 | Ht 65.0 in | Wt 140.0 lb

## 2021-03-11 DIAGNOSIS — S86112A Strain of other muscle(s) and tendon(s) of posterior muscle group at lower leg level, left leg, initial encounter: Secondary | ICD-10-CM | POA: Insufficient documentation

## 2021-03-11 DIAGNOSIS — M25551 Pain in right hip: Secondary | ICD-10-CM

## 2021-03-11 MED ORDER — MELOXICAM 15 MG PO TABS
15.0000 mg | ORAL_TABLET | Freq: Every day | ORAL | 0 refills | Status: DC
Start: 2021-03-11 — End: 2021-04-04
  Filled 2021-03-11: qty 30, 30d supply, fill #0

## 2021-03-11 NOTE — Assessment & Plan Note (Signed)
MRI confirmed gastrocnemius medial head at the border of the soleus strain, given her examination findings the setting of relative tightness/deconditioning inclusive of the Achilles tendon.  Negative Thompson test, negative Homans, examination otherwise benign.  Treatment strategies outlined and she will start physical therapy, can continue supportive care with heat, compression sleeve, and activity modification.  This is most likely secondary to her ongoing right lateral hip symptoms.

## 2021-03-11 NOTE — Assessment & Plan Note (Signed)
Chronic roughly 10-year condition, ongoing symptomatology of right lateral hip pain without significant radiation.  She has been through physical therapy and prescribed topical nitroglycerin patch treatment without resolution.  Examination findings show focal tenderness at the right greater trochanter, from the gluteus medius/minimus distribution and right SI joint, equivocal Ober's test, positive Faber, negative straight leg raise.  Constellation of symptoms is most consistent with right greater trochanteric pain syndrome, compensatory findings can most likely attributed to her left gastrocnemius-soleus strain.  I have advised treatment strategies and we will initiate scheduled meloxicam, restart of physical therapy, and low threshold for corticosteroid injection if symptoms fail to improve.  We will have her return for follow-up in 4 weeks for reassessment.

## 2021-03-11 NOTE — Progress Notes (Signed)
Primary Care / Sports Medicine Office Visit  Patient Information:  Patient ID: Christine Ball, female DOB: 08-Sep-1980 Age: 40 y.o. MRN: [id number]   Christine Ball is a pleasant 40 y.o. female presenting with the following:  Chief Complaint  Patient presents with   New Patient (Initial Visit)   Leg Pain    Left; injury while running 02/27/21, patient stated she felt and heard a pop; MRI 03/04/21; taking Advil, heat, and ice with little relief; no pain in office, but pain when going uphill or balancing on it    Review of Systems pertinent details above   Patient Active Problem List   Diagnosis Date Noted   Greater trochanteric pain syndrome of right lower extremity 03/11/2021   Strain of gastrocnemius muscle of left lower extremity 03/11/2021   Insomnia, persistent 03/14/2015   GAD (generalized anxiety disorder) 03/14/2015   Dermatitis, eczematoid 03/14/2015   Strict vegetarian diet 03/14/2015   Nonallopathic lesion of lumbosacral region 10/08/2014   Gluteal tendinitis of right buttock 08/21/2014   Past Medical History:  Diagnosis Date   Anxiety    Chronic insomnia    Eczema    Right hip pain    Outpatient Encounter Medications as of 03/11/2021  Medication Sig   cholecalciferol (VITAMIN D3) 25 MCG (1000 UNIT) tablet Take 1,000 Units by mouth daily.   [employer] MELATONIN GUMMIES PO Take 1 each by mouth every evening. It is a combo of melatonin 3mg   and CBD 10 mg   doxylamine, Sleep, (UNISOM) 25 MG tablet Take 12.5 mg by mouth every evening.   Ferrous Sulfate (IRON PO) Take 1 tablet by mouth as needed.   meloxicam (MOBIC) 15 MG tablet Take 1 tablet (15 mg total) by mouth daily.   Multiple Vitamin (MULTIVITAMIN) tablet Take 1 tablet by mouth daily.   Omega-3 Fatty Acids (FISH OIL) 1000 MG CAPS Take 2 each by mouth daily.   psyllium (METAMUCIL) 58.6 % packet Take 1 packet by mouth daily.   No facility-administered encounter medications on file as of 03/11/2021.   Past  Surgical History:  Procedure Laterality Date   WISDOM TOOTH EXTRACTION      Vitals:   03/11/21 0914  BP: 98/74  Pulse: 80  SpO2: 98%   Vitals:   03/11/21 0914  Weight: 140 lb (63.5 kg)  Height: 5\' 5"  (1.651 m)   Body mass index is 23.3 kg/m.  MR TIBIA FIBULA LEFT WO CONTRAST  Result Date: 03/05/2021 CLINICAL DATA:  Left calf pain.  Felt a pop while running. EXAM: MRI OF LOWER LEFT EXTREMITY WITHOUT CONTRAST TECHNIQUE: Multiplanar, multisequence MR imaging of the left lower leg was performed. No intravenous contrast was administered. COMPARISON:  None. FINDINGS: Bones/Joint/Cartilage No fracture or dislocation. Normal alignment. No joint effusion. No marrow signal abnormality. Ligaments Collateral ligaments are intact. Muscles and Tendons Small vertical myofascial tear along the anterior surface of the left medial gastrocnemius muscle with a small sliver of a hematoma between the medial gastrocnemius and soleus muscles. Mild adjacent muscle strain of the medial gastrocnemius muscle. Remainder of the muscles demonstrate no focal abnormality. No muscle atrophy. Flexor, extensor, peroneal and Achilles tendons are intact. Soft tissue No fluid collection or hematoma. No soft tissue mass. Small cystic area in Kager's fat measuring 7 mm likely reflecting a small ganglion cyst. IMPRESSION: Small vertical myofascial tear along the anterior surface of the left medial gastrocnemius muscle with a small sliver of a hematoma between the medial gastrocnemius and soleus muscles.  Mild adjacent muscle strain of the medial gastrocnemius muscle. Intact Achilles tendon. Electronically Signed   By: Kathreen Devoid M.D.   On: 03/05/2021 07:05   MM 3D SCREEN BREAST BILATERAL  Result Date: 02/25/2021 CLINICAL DATA:  Screening. EXAM: DIGITAL SCREENING BILATERAL MAMMOGRAM WITH TOMOSYNTHESIS AND CAD TECHNIQUE: Bilateral screening digital craniocaudal and mediolateral oblique mammograms were obtained. Bilateral screening  digital breast tomosynthesis was performed. The images were evaluated with computer-aided detection. COMPARISON:  Previous exam(s). ACR Breast Density Category b: There are scattered areas of fibroglandular density. FINDINGS: There are no findings suspicious for malignancy. IMPRESSION: No mammographic evidence of malignancy. A result letter of this screening mammogram will be mailed directly to the patient. RECOMMENDATION: Screening mammogram in one year. (Code:SM-B-01Y) BI-RADS CATEGORY  1: Negative. Electronically Signed   By: Abelardo Diesel M.D.   On: 02/25/2021 08:56    Independent interpretation of notes and tests performed by another provider:   None  Procedures performed:   None  Pertinent History, Exam, Impression, and Recommendations:   Greater trochanteric pain syndrome of right lower extremity Chronic roughly 10-year condition, ongoing symptomatology of right lateral hip pain without significant radiation.  She has been through physical therapy and prescribed topical nitroglycerin patch treatment without resolution.  Examination findings show focal tenderness at the right greater trochanter, from the gluteus medius/minimus distribution and right SI joint, equivocal Ober's test, positive Faber, negative straight leg raise.  Constellation of symptoms is most consistent with right greater trochanteric pain syndrome, compensatory findings can most likely attributed to her left gastrocnemius-soleus strain.  I have advised treatment strategies and we will initiate scheduled meloxicam, restart of physical therapy, and low threshold for corticosteroid injection if symptoms fail to improve.  We will have her return for follow-up in 4 weeks for reassessment.  Strain of gastrocnemius muscle of left lower extremity MRI confirmed gastrocnemius medial head at the border of the soleus strain, given her examination findings the setting of relative tightness/deconditioning inclusive of the Achilles  tendon.  Negative Thompson test, negative Homans, examination otherwise benign.  Treatment strategies outlined and she will start physical therapy, can continue supportive care with heat, compression sleeve, and activity modification.  This is most likely secondary to her ongoing right lateral hip symptoms.   Orders & Medications Meds ordered this encounter  Medications   meloxicam (MOBIC) 15 MG tablet    Sig: Take 1 tablet (15 mg total) by mouth daily.    Dispense:  30 tablet    Refill:  0   Orders Placed This Encounter  Procedures   Ambulatory referral to Physical Therapy     Return in about 27 days (around 04/07/2021).     Montel Culver, MD   Primary Care Sports Medicine [place]

## 2021-03-11 NOTE — Patient Instructions (Signed)
-   Start meloxicam once daily with food (no other NSAIDs once medication) - Contact physical therapy number below and start sessions, they will provide home exercises to perform daily - Can perform gentle limited running (distance and intensity) once you have established with PT and reviewed it with them - Return for follow-up in 4 weeks, contact us for any questions between now and then  [facility] Physical Therapy:  [place]: [phone]

## 2021-03-13 ENCOUNTER — Ambulatory Visit: Payer: 59 | Attending: Family Medicine | Admitting: Physical Therapy

## 2021-03-13 ENCOUNTER — Encounter: Payer: Self-pay | Admitting: Physical Therapy

## 2021-03-13 DIAGNOSIS — M25551 Pain in right hip: Secondary | ICD-10-CM | POA: Diagnosis not present

## 2021-03-13 DIAGNOSIS — S86112A Strain of other muscle(s) and tendon(s) of posterior muscle group at lower leg level, left leg, initial encounter: Secondary | ICD-10-CM | POA: Diagnosis not present

## 2021-03-13 NOTE — Therapy (Signed)
[facility] [address], [place], [address] Phone: [phone]   Fax:  [phone]  Physical Therapy Treatment  Patient Details  Name: Christine Ball MRN: [id number] Date of Birth: 10-Oct-1980 No data recorded  Encounter Date: 03/13/2021   PT End of Session - 03/13/21 1656     Visit Number 1    Number of Visits 17    Date for PT Re-Evaluation 05/09/21    PT Start Time 1515    PT Stop Time 1600    PT Time Calculation (min) 45 min    Activity Tolerance Patient tolerated treatment well    Behavior During Therapy [facility] for tasks assessed/performed             Past Medical History:  Diagnosis Date   Anxiety    Chronic insomnia    Eczema    Right hip pain     Past Surgical History:  Procedure Laterality Date   WISDOM TOOTH EXTRACTION      There were no vitals filed for this visit.   Subjective Assessment - 03/13/21 1518     Pertinent History Patient is a 40 year old female presenting with R hip pain over 10 years with recent exacerbation 02/26/21. Was running on flat concrete at 3rd mile of usual 28mile run (completed 3 days a week) and felt a pop at the calf (MRI demonstrates medial gastroc strain 03/05/21), and later felt R hip pain worse than usual. Was able to walk back home following this. Pain is at lateral hip is constant 2-3/10 and 7/10 at its worst. Pain is localized to greater trochanter with some radiation into superior buttock. Pain is worse with running over 82miles or more days a week than 3, sleeping and laying on R side, stairs, uphill walking, lateral movements, and running on alternative terrain. Patient walks daily 1-63miles walking large dog, and runs 3x/week 35miles. Patient enjoys paddleboarding, reading, and is a mom to 2 teenagers. Enjoys running 5Ks with her family. Secondary L gastroc pain is improving with heat, rest, ibprofen with only 1/10 with aggravating movements (stairs,  hill walking, feeling off balance). Has had several bouts of PT in the past unsuccessful with TENs, active pump, lateral strenthening, but she has not completed HEP since last bout of PT. Pt denies N/V, B&B changes, unexplained weight fluctuation, saddle paresthesia, fever, night sweats, or unrelenting night pain at this time.    Limitations Sitting    How long can you sit comfortably? 30-33min    How long can you stand comfortably? unlimited    How long can you walk comfortably? unilimited    Diagnostic tests MRI of gastroc grade 1 strain; MRI of r hip    Patient Stated Goals run without pain    Currently in Pain? Yes    Pain Score 3     Pain Location Hip    Pain Orientation Right    Pain Descriptors / Indicators Dull;Burning    Pain Type Chronic pain    Pain Radiating Towards back into buttock    Pain Onset More than a month ago    Pain Frequency Constant    Aggravating Factors  running >51miles, sitting >30-107mins, walking uphill or stairs, running >3days a week    Pain Relieving Factors alieve, icing, rest    Effect of Pain on Daily Activities unable to complete normal training regimen  OBJECTIVE  Mental Status Patient is oriented to person, place and time.  Recent memory is intact.  Remote memory is intact.  Attention span and concentration are intact.  Expressive speech is intact.  Patient's fund of knowledge is within normal limits for educational level.   Gait Bilat trendelenburg/ decreased lumbopelvic stability, increased speed transitioning from heel strike to foot flat bilat; decreased L foot push off  Running: Much more pronounced lack of lumbopelvic control with heel strike, trendelenberg R>L, bilat knee valgus (narrow gait), slightly decreased L push off   Posture Lumbar lordosis: WNL Iliac crest height: equal bilaterally Lumbar lateral shift: negative Strong sense of neutral posture, slight lower crossed with ant pelvic tilt Slightly  increased thoracic kyphosis  AROM (degrees) All hip and lumbar motions WNL except: Hip IR (0-45): R: 34d L: 46d *Indicates pain   PROM (degrees) PROM = AROM Slightly increased hip IR to PROM on R approx 40d  Strength (out of 5) R/L 5/5 Hip flexion 4*/5 Hip ER (4/5 on R in all phases of range) 5/5 Hip IR 4+/4+ Hip abduction 5/5 Hip adduction 4+/4+ Hip extension *Indicates pain Able to complete full height heel raise bilat, repeated SL heel raise not performed  Palpation Concordant pain sign to palpation over R greater trochanter and glute medius near iliac crest origin, tension noted with latent trigger points at superior glute max fibers, TFL, superior half of ITB, and to deep palpation over glute min  Muscle Length Hamstrings: negative bilat [place]: negative bilat Thomas: full motion bilat, but patient reporting increased tension and "stretch" with R testing Ober: Positive R hip    SPECIAL TESTS  Hip: FABER (SN 81): R: Positive L: Negative FADIR (SN 94): R: Positive L: Negative Hip scour (SN 50): R: Negative L: Negative   SIJ:  Thigh Thrust (SN 88, -LR 0.18) : R: Negative L: Negative   Piriformis Syndrome: FAIR Test (SN 88, SP 83): R: Negative L: Negative  Nobles Test Negative bilat  Functional Tasks Lateral Step-Down Test: R: Positive L: Positive Hip drop with hip IR, knee valgus, and midfoot pronation R>L Hip hike on wall able to complete with proper technique with fatigue at 13sec SLS able to complete >57min bilat with no pain, slight trendelenburg at final seconds of R SLS; increased ankle strategy for L SLS d/t gastroc pain  STS normal without difficulty Stairs normalized  Deferred SL heel raise test to failure, SL squat and squat assessment  Ther-Ex PT reviewed the following HEP with patient with patient able to demonstrate a set of the following with min cuing for correction needed. PT educated patient on parameters of therex (how/when to inc/decrease  intensity, frequency, rep/set range, stretch hold time, and purpose of therex) with verbalized understanding.   Access Code: [email or web address] Standing Hip Hiking - 1 x daily - 3 x weekly - 3 reps - 15sec hold Eccentric Heel Lowering on Step - 1 x daily - 3 x weekly - 3 sets - 10 reps Heat 10-76mins 1-2x/day   PT reviewed running mechanics with patient and educated on importance of strength/stabilization exercises in conjunction with running regimen to increase lumbopelvic stability to increase efficiency of running and decrease pain with understanding          PT Education - 03/13/21 1655     Education Details Patient was educated on diagnosis, anatomy and pathology involved, prognosis, role of PT, and was given an HEP, demonstrating exercise with proper form following verbal and tactile cues, and was given  a paper hand out to continue exercise at home. Pt was educated on and agreed to plan of care.    Person(s) Educated Patient    Methods Explanation;Demonstration;Verbal cues    Comprehension Verbalized understanding;Returned demonstration;Verbal cues required              PT Short Term Goals - 03/13/21 1656       PT SHORT TERM GOAL #1   Title Pt will be independent with HEP in order to improve strength and decrease back pain in order to improve pain-free function at home and work.    Baseline 03/13/21 HEP given    Time 4    Period Weeks    Status New               PT Long Term Goals - 03/13/21 1657       PT LONG TERM GOAL #1   Title Patient will increase FOTO score to 80 to demonstrate predicted increase in functional mobility to complete ADLs    Baseline 03/13/21 70    Time 8    Period Weeks    Status New      PT LONG TERM GOAL #2   Title Pt will decrease worst back pain as reported on NPRS by at least 2 points in order to demonstrate clinically significant reduction in back pain.    Baseline 03/13/21 7/10    Time 8    Period Weeks    Status New                    Plan - 03/14/21 0932     Clinical Impression Statement Pt is a 40 year old female presenting with R lateral hip pain (signs and symptoms of GTPS due to glute med/min dysfunction), secondary complaint of L gastroc pain (MRI strain of medial head 02/27/21). Impairments in functional lateral hip strength, lumbopelvic stability, motor control, increased muscle tension/tone, abnormal running mechanics, and pain. Activity limitations in prolonged sitting, long distance running, and stair and hill negotiation; inhibiting community ADLs and normal running regimen for exercise. Pt educated on current exercise reigmen modifications > complete rest, and the importance of a multi-faceted program including strength/power training with verbalized understanding. Pt will benefit from skilled PT to address aforementioned impairments to return to optimal PLOF    Personal Factors and Comorbidities Past/Current Experience;Time since onset of injury/illness/exacerbation;Fitness    Examination-Activity Limitations Lift;Stairs;Sit    Examination-Participation Restrictions Community Activity;Driving   long distance running   Stability/Clinical Decision Making Evolving/Moderate complexity    Clinical Decision Making Moderate    Rehab Potential Good    PT Frequency 2x / week    PT Duration 8 weeks    PT Treatment/Interventions ADLs/Self Care Home Management;Stair training;Therapeutic exercise;Passive range of motion;Electrical Stimulation;Moist Heat;Gait training;Therapeutic activities;Neuromuscular re-education;Balance training;Functional mobility training;Traction;Ultrasound;Dry needling;Joint Manipulations;Spinal Manipulations;Taping;Manual techniques    PT Next Visit Plan SL heel raise test, SL squat test    PT Home Exercise Plan isometric hip hike, eccentric heel lower, heat application, activity modifications    Consulted and Agree with Plan of Care Patient             Patient will benefit from  skilled therapeutic intervention in order to improve the following deficits and impairments:  Postural dysfunction, Abnormal gait, Decreased activity tolerance, Decreased endurance, Decreased strength, Increased fascial restricitons, Pain, Improper body mechanics, Decreased mobility, Decreased balance, Impaired flexibility, Impaired tone  Visit Diagnosis: Pain in right hip     Problem List Patient  Active Problem List   Diagnosis Date Noted   Greater trochanteric pain syndrome of right lower extremity 03/11/2021   Strain of gastrocnemius muscle of left lower extremity 03/11/2021   Insomnia, persistent 03/14/2015   GAD (generalized anxiety disorder) 03/14/2015   Dermatitis, eczematoid 03/14/2015   Strict vegetarian diet 03/14/2015   Nonallopathic lesion of lumbosacral region 10/08/2014   Gluteal tendinitis of right buttock 08/21/2014   Durwin Reges DPT Durwin Reges, PT 03/14/2021, 8:55 AM  [place] [facility] [address], [place], [address] Phone: [phone]   Fax:  [phone]  Name: KARRINE KLUTTZ MRN: [id number] Date of Birth: [birth year]

## 2021-03-17 ENCOUNTER — Ambulatory Visit: Admission: RE | Admit: 2021-03-17 | Payer: 59 | Source: Ambulatory Visit

## 2021-03-19 ENCOUNTER — Ambulatory Visit: Payer: 59 | Attending: Family Medicine | Admitting: Physical Therapy

## 2021-03-19 ENCOUNTER — Encounter: Payer: Self-pay | Admitting: Physical Therapy

## 2021-03-19 DIAGNOSIS — M25551 Pain in right hip: Secondary | ICD-10-CM | POA: Insufficient documentation

## 2021-03-19 NOTE — Therapy (Signed)
[place] [facility] [address], [place], [address] Phone: [phone]   Fax:  [phone]  Physical Therapy Treatment  Patient Details  Name: Christine Ball MRN: [id number] Date of Birth: 09-Feb-1981 No data recorded  Encounter Date: 03/19/2021   PT End of Session - 03/19/21 1101     Visit Number 2    Number of Visits 17    Date for PT Re-Evaluation 05/09/21    PT Start Time 1030    PT Stop Time 1110    PT Time Calculation (min) 40 min    Activity Tolerance Patient tolerated treatment well    Behavior During Therapy [facility] for tasks assessed/performed             Past Medical History:  Diagnosis Date   Anxiety    Chronic insomnia    Eczema    Right hip pain     Past Surgical History:  Procedure Laterality Date   WISDOM TOOTH EXTRACTION      There were no vitals filed for this visit.   Subjective Assessment - 03/19/21 1128     Subjective Pt reports some increased soreness today at R hip and L calf d/t having to hussle to get out of the park this weekend before it closed. Has been keeping up with running regimen. Completing HEP. No medical changes since last visit.    Pertinent History Patient is a 40 year old female presenting with R hip pain over 10 years with recent exacerbation 02/26/21. Was running on flat concrete at 3rd mile of usual 38mile run (completed 3 days a week) and felt a pop at the calf (MRI demonstrates medial gastroc strain 03/05/21), and later felt R hip pain worse than usual. Was able to walk back home following this. Pain is at lateral hip is constant 2-3/10 and 7/10 at its worst. Pain is localized to greater trochanter with some radiation into superior buttock. Pain is worse with running over 12miles or more days a week than 3, sleeping and laying on R side, stairs, uphill walking, lateral movements, and running on alternative terrain. Patient walks daily 1-10miles walking large dog, and  runs 3x/week 77miles. Patient enjoys paddleboarding, reading, and is a mom to 2 teenagers. Enjoys running 5Ks with her family. Secondary L gastroc pain is improving with heat, rest, ibprofen with only 1/10 with aggravating movements (stairs, hill walking, feeling off balance). Has had several bouts of PT in the past unsuccessful with TENs, active pump, lateral strenthening, but she has not completed HEP since last bout of PT. Pt denies N/V, B&B changes, unexplained weight fluctuation, saddle paresthesia, fever, night sweats, or unrelenting night pain at this time.    Limitations Sitting    How long can you sit comfortably? 30-70min    How long can you stand comfortably? unlimited    How long can you walk comfortably? unilimited    Diagnostic tests MRI of gastroc grade 1 strain; MRI of r hip    Patient Stated Goals run without pain    Pain Onset More than a month ago             Manual STM with trigger point release to glute med and glute max over deep rotators Dry Needling: (2) 21mm .25 needles placed along the R superior glute gibers with target of glute min, and over deep rotators with target of piriformis to decrease increased muscular spasms and trigger points with the patient positioned in  prone. Patient was educated on risks and benefits of therapy and verbally consents to PT.    Ther-Ex Hip hike isometric on wall 3x 15sec  Hip hike from 3in step 3 x10 with good carry over of demo  [place] split squat 3x 8/7/6 bilat with increased difficulty with RLE, maintained good technique following demo SL RDL BW 2x 6 with min cuing for technique, noted muscle fatigue following    Pt able to demonstrate a set of the following stretches with 30sec hold, educated on parameters and incorporating into post run stretching regimen.  Access Code: [email or web address] Seated Piriformis Stretch - 2-3 x daily - 7 x weekly - 30-60 hold Seated Piriformis Stretch with [place] - 2-3 x daily - 7 x weekly - 30-60  hold                           PT Education - 03/19/21 1055     Education Details therex form/technique    Person(s) Educated Patient    Methods Explanation;Demonstration;Verbal cues    Comprehension Verbalized understanding;Returned demonstration;Verbal cues required              PT Short Term Goals - 03/13/21 1656       PT SHORT TERM GOAL #1   Title Pt will be independent with HEP in order to improve strength and decrease back pain in order to improve pain-free function at home and work.    Baseline 03/13/21 HEP given    Time 4    Period Weeks    Status New               PT Long Term Goals - 03/13/21 1657       PT LONG TERM GOAL #1   Title Patient will increase FOTO score to 80 to demonstrate predicted increase in functional mobility to complete ADLs    Baseline 03/13/21 70    Time 8    Period Weeks    Status New      PT LONG TERM GOAL #2   Title Pt will decrease worst back pain as reported on NPRS by at least 2 points in order to demonstrate clinically significant reduction in back pain.    Baseline 03/13/21 7/10    Time 8    Period Weeks    Status New                   Plan - 03/19/21 1150     Clinical Impression Statement PT utilized manual with TDN techniques to decrease muscle tension of glute min/mx, and deep rotators with increased hip IR by 10d following. PT utilized decreased tension and increased recruitment to perform therex for lateral hip and core stabilization with success. Patient is able to comply with all cuing for proper technique of therex with good carry over and motivation throughout session. PT updated HEP to reflect therex for maintaining of increased soft tissue extensibility with undersatnding.  PT will continue progression as able.    Personal Factors and Comorbidities Past/Current Experience;Time since onset of injury/illness/exacerbation;Fitness    Examination-Activity Limitations Lift;Stairs;Sit     Examination-Participation Restrictions Community Activity;Driving    Stability/Clinical Decision Making Evolving/Moderate complexity    Clinical Decision Making Moderate    Rehab Potential Good    PT Frequency 2x / week    PT Duration 8 weeks    PT Treatment/Interventions ADLs/Self Care Home Management;Stair training;Therapeutic exercise;Passive range of motion;Electrical Stimulation;Moist Heat;Gait training;Therapeutic activities;Neuromuscular  re-education;Balance training;Functional mobility training;Traction;Ultrasound;Dry needling;Joint Manipulations;Spinal Manipulations;Taping;Manual techniques    PT Next Visit Plan SL heel raise test, SL squat test    PT Home Exercise Plan isometric hip hike, eccentric heel lower, heat application, activity modifications    Consulted and Agree with Plan of Care Patient             Patient will benefit from skilled therapeutic intervention in order to improve the following deficits and impairments:  Postural dysfunction, Abnormal gait, Decreased activity tolerance, Decreased endurance, Decreased strength, Increased fascial restricitons, Pain, Improper body mechanics, Decreased mobility, Decreased balance, Impaired flexibility, Impaired tone  Visit Diagnosis: Pain in right hip     Problem List Patient Active Problem List   Diagnosis Date Noted   Greater trochanteric pain syndrome of right lower extremity 03/11/2021   Strain of gastrocnemius muscle of left lower extremity 03/11/2021   Insomnia, persistent 03/14/2015   GAD (generalized anxiety disorder) 03/14/2015   Dermatitis, eczematoid 03/14/2015   Strict vegetarian diet 03/14/2015   Nonallopathic lesion of lumbosacral region 10/08/2014   Gluteal tendinitis of right buttock 08/21/2014   Durwin Reges DPT Durwin Reges, PT 03/19/2021, 11:54 AM  [facility] [address], [place], [address] Phone: [phone]    Fax:  [phone]  Name: CHRYL HOLTEN MRN: [id number] Date of Birth: 15-Nov-1980

## 2021-03-21 ENCOUNTER — Ambulatory Visit: Payer: 59 | Admitting: Physical Therapy

## 2021-03-21 ENCOUNTER — Encounter: Payer: Self-pay | Admitting: Physical Therapy

## 2021-03-21 ENCOUNTER — Other Ambulatory Visit: Payer: Self-pay

## 2021-03-21 DIAGNOSIS — M25551 Pain in right hip: Secondary | ICD-10-CM

## 2021-03-21 NOTE — Therapy (Signed)
[place] [facility] [address] [place], [address] Phone: [phone]   Fax:  [phone]  Physical Therapy Treatment  Patient Details  Name: Christine Ball MRN: [id number] Date of Birth: [birth year] No data recorded  Encounter Date: 03/21/2021   PT End of Session - 03/21/21 0934     Visit Number 3    Number of Visits 17    Date for PT Re-Evaluation 05/09/21    PT Start Time 0845    PT Stop Time 0930    PT Time Calculation (min) 45 min    Activity Tolerance Patient tolerated treatment well    Behavior During Therapy [facility] for tasks assessed/performed             Past Medical History:  Diagnosis Date   Anxiety    Chronic insomnia    Eczema    Right hip pain     Past Surgical History:  Procedure Laterality Date   WISDOM TOOTH EXTRACTION      There were no vitals filed for this visit.   Subjective Assessment - 03/21/21 0850     Subjective Pt reports some increased bilat glute soreness following exercises at last session. Reports minimal pain overall, calf is still improving. Did not complete stretching due to pain. Was able to complete a 18mile run and felt much "looser" following TDN    Pertinent History Patient is a 40 year old female presenting with R hip pain over 10 years with recent exacerbation 02/26/21. Was running on flat concrete at 3rd mile of usual 26mile run (completed 3 days a week) and felt a pop at the calf (MRI demonstrates medial gastroc strain 03/05/21), and later felt R hip pain worse than usual. Was able to walk back home following this. Pain is at lateral hip is constant 2-3/10 and 7/10 at its worst. Pain is localized to greater trochanter with some radiation into superior buttock. Pain is worse with running over 29miles or more days a week than 3, sleeping and laying on R side, stairs, uphill walking, lateral movements, and running on alternative terrain. Patient walks daily 1-10miles  walking large dog, and runs 3x/week 66miles. Patient enjoys paddleboarding, reading, and is a mom to 2 teenagers. Enjoys running 5Ks with her family. Secondary L gastroc pain is improving with heat, rest, ibprofen with only 1/10 with aggravating movements (stairs, hill walking, feeling off balance). Has had several bouts of PT in the past unsuccessful with TENs, active pump, lateral strenthening, but she has not completed HEP since last bout of PT. Pt denies N/V, B&B changes, unexplained weight fluctuation, saddle paresthesia, fever, night sweats, or unrelenting night pain at this time.    Limitations Sitting    How long can you sit comfortably? 30-28min    How long can you stand comfortably? unlimited    How long can you walk comfortably? unilimited    Diagnostic tests MRI of gastroc grade 1 strain; MRI of r hip    Patient Stated Goals run without pain    Pain Onset More than a month ago             Manual STM with trigger point release to glute med and glute max over deep rotators bilat Dry Needling: (2/2) 91mm .25 needles placed along the R superior glute gibers with target of glute min, and over deep rotators with target of piriformis bilat to decrease increased muscular spasms and trigger points with the patient positioned in  prone. Patient was educated on risks and benefits of therapy and verbally consents to PT.      Ther-Ex Nustep L 5 44min warmup with cuing for RPM between 60-70 Hip hike + CLLE swing x20 bilat; on large dina disc with min cuing for set up posture Side stepping RTB 66ft each direction in athletic stance Lateral step down + CLLE hip flex 2x 6 bilat with min cuing for eccentric control with good carry over Supine glute and piriformis stretch 30sec bilat  Education on completing stretching and utilization of heat to reduce post-exercise soreness with understanding                               PT Education - 03/21/21 0933     Education  Details therex form/technique    Person(s) Educated Patient    Methods Explanation;Verbal cues;Demonstration    Comprehension Verbalized understanding;Verbal cues required;Returned demonstration              PT Short Term Goals - 03/13/21 1656       PT SHORT TERM GOAL #1   Title Pt will be independent with HEP in order to improve strength and decrease back pain in order to improve pain-free function at home and work.    Baseline 03/13/21 HEP given    Time 4    Period Weeks    Status New               PT Long Term Goals - 03/13/21 1657       PT LONG TERM GOAL #1   Title Patient will increase FOTO score to 80 to demonstrate predicted increase in functional mobility to complete ADLs    Baseline 03/13/21 70    Time 8    Period Weeks    Status New      PT LONG TERM GOAL #2   Title Pt will decrease worst back pain as reported on NPRS by at least 2 points in order to demonstrate clinically significant reduction in back pain.    Baseline 03/13/21 7/10    Time 8    Period Weeks    Status New                   Plan - 03/21/21 6010     Clinical Impression Statement PT continued therex for increased lateral stability, with some regression in intensity to prevent excess post exercise soreness. PT continued to utilize TDN with manual and stretching techniques for relief of soft tissue tension, and pain with success. Patient is able to comply with all cuing for proper technique of therex, with increased focus of lateral stability with contralateral LE motion (stimulate stabilization needed while running) with success. Pt motivated throughout session with no complaints of increased pain. PT will continue progression as able.    Personal Factors and Comorbidities Past/Current Experience;Time since onset of injury/illness/exacerbation;Fitness    Examination-Activity Limitations Lift;Stairs;Sit    Examination-Participation Restrictions Community Activity;Driving     Stability/Clinical Decision Making Evolving/Moderate complexity    Clinical Decision Making Moderate    Rehab Potential Good    PT Frequency 2x / week    PT Duration 8 weeks    PT Treatment/Interventions ADLs/Self Care Home Management;Stair training;Therapeutic exercise;Passive range of motion;Electrical Stimulation;Moist Heat;Gait training;Therapeutic activities;Neuromuscular re-education;Balance training;Functional mobility training;Traction;Ultrasound;Dry needling;Joint Manipulations;Spinal Manipulations;Taping;Manual techniques    PT Next Visit Plan SL heel raise test, SL squat test    PT Home  Exercise Plan isometric hip hike, eccentric heel lower, heat application, activity modifications    Consulted and Agree with Plan of Care Patient             Patient will benefit from skilled therapeutic intervention in order to improve the following deficits and impairments:  Postural dysfunction, Abnormal gait, Decreased activity tolerance, Decreased endurance, Decreased strength, Increased fascial restricitons, Pain, Improper body mechanics, Decreased mobility, Decreased balance, Impaired flexibility, Impaired tone  Visit Diagnosis: Pain in right hip     Problem List Patient Active Problem List   Diagnosis Date Noted   Greater trochanteric pain syndrome of right lower extremity 03/11/2021   Strain of gastrocnemius muscle of left lower extremity 03/11/2021   Insomnia, persistent 03/14/2015   GAD (generalized anxiety disorder) 03/14/2015   Dermatitis, eczematoid 03/14/2015   Strict vegetarian diet 03/14/2015   Nonallopathic lesion of lumbosacral region 10/08/2014   Gluteal tendinitis of right buttock 08/21/2014   Durwin Reges DPT Durwin Reges, PT 03/21/2021, 9:49 AM  [place] [facility] [address] [place], [address] Phone: [phone]   Fax:  [phone]  Name: JACOLYN JOAQUIN MRN: [id number] Date of  Birth: 1980/07/07

## 2021-03-24 ENCOUNTER — Encounter: Payer: 59 | Admitting: Physical Therapy

## 2021-03-25 ENCOUNTER — Encounter: Payer: Self-pay | Admitting: Physical Therapy

## 2021-03-25 ENCOUNTER — Ambulatory Visit: Payer: 59 | Admitting: Physical Therapy

## 2021-03-25 ENCOUNTER — Encounter: Payer: 59 | Admitting: Certified Nurse Midwife

## 2021-03-25 DIAGNOSIS — M25551 Pain in right hip: Secondary | ICD-10-CM

## 2021-03-25 NOTE — Therapy (Signed)
[place] [facility] [address], [place], [address] Phone: [phone]   Fax:  [phone]  Physical Therapy Treatment  Patient Details  Name: Christine Ball MRN: [id number] Date of Birth: [birth year] No data recorded  Encounter Date: 03/25/2021   PT End of Session - 03/26/21 0827     Visit Number 4    Number of Visits 17    Date for PT Re-Evaluation 05/09/21    PT Start Time 1030    PT Stop Time 1115    PT Time Calculation (min) 45 min    Activity Tolerance Patient tolerated treatment well    Behavior During Therapy [facility] for tasks assessed/performed             Past Medical History:  Diagnosis Date   Anxiety    Chronic insomnia    Eczema    Right hip pain     Past Surgical History:  Procedure Laterality Date   WISDOM TOOTH EXTRACTION      There were no vitals filed for this visit.   Subjective Assessment - 03/25/21 1034     Subjective Felt like TDN helped with muscle soreness. Went hiking over the weekend with lateral hip pain with the downhill of the hike (was a longer a hike than usual about 21miles), felt good going up in the hike, but had more pain on going down the hike than she anticipated recalling it 5/10. Reports she ran yesterday and had no pain during, just fatigue.    Pertinent History Patient is a 40 year old female presenting with R hip pain over 10 years with recent exacerbation 02/26/21. Was running on flat concrete at 3rd mile of usual 83mile run (completed 3 days a week) and felt a pop at the calf (MRI demonstrates medial gastroc strain 03/05/21), and later felt R hip pain worse than usual. Was able to walk back home following this. Pain is at lateral hip is constant 2-3/10 and 7/10 at its worst. Pain is localized to greater trochanter with some radiation into superior buttock. Pain is worse with running over 38miles or more days a week than 3, sleeping and laying on R side, stairs,  uphill walking, lateral movements, and running on alternative terrain. Patient walks daily 1-2miles walking large dog, and runs 3x/week 57miles. Patient enjoys paddleboarding, reading, and is a mom to 2 teenagers. Enjoys running 5Ks with her family. Secondary L gastroc pain is improving with heat, rest, ibprofen with only 1/10 with aggravating movements (stairs, hill walking, feeling off balance). Has had several bouts of PT in the past unsuccessful with TENs, active pump, lateral strenthening, but she has not completed HEP since last bout of PT. Pt denies N/V, B&B changes, unexplained weight fluctuation, saddle paresthesia, fever, night sweats, or unrelenting night pain at this time.    Limitations Sitting    How long can you sit comfortably? 30-42min    How long can you stand comfortably? unlimited    How long can you walk comfortably? unilimited    Diagnostic tests MRI of gastroc grade 1 strain; MRI of r hip    Patient Stated Goals run without pain    Pain Onset More than a month ago               Ther-Ex Nustep L 5 52min warmup with cuing for RPM between 60-70  Triple hop  R: 21ft/9.5ft L 9.29ft for both trials  Eccenric lateral step down (4sec lower)  with fast return step up 2x 10 bilat  Loading step off 6in x6 bilat; from jump off 6in step x6 with good carry over of   Squat on bosu (hardside) 3x 10 with cuing for slow lower and preventing R ankle eversion with good carry over  Lunge onto bosu hard side with step back to SLS 2x 6 with mirror for visual cuing with good carry over  Manual In side lying rolling to ITB below GT for comfort increased blood flow, to glute med/min, and TFL to tolerated pressure            PT Education - 03/26/21 0826     Education Details therex form/technique, landing mechanics    Person(s) Educated Patient    Methods Explanation;Demonstration;Verbal cues    Comprehension Verbalized understanding;Verbal cues required;Returned demonstration               PT Short Term Goals - 03/13/21 1656       PT SHORT TERM GOAL #1   Title Pt will be independent with HEP in order to improve strength and decrease back pain in order to improve pain-free function at home and work.    Baseline 03/13/21 HEP given    Time 4    Period Weeks    Status New               PT Long Term Goals - 03/26/21 0830       PT LONG TERM GOAL #1   Title Patient will increase FOTO score to 80 to demonstrate predicted increase in functional mobility to complete ADLs    Baseline 03/13/21 70    Time 8    Period Weeks    Status New      PT LONG TERM GOAL #2   Title Pt will decrease worst hip pain as reported on NPRS by at least 2 points in order to demonstrate clinically significant reduction in back pain.    Baseline 03/13/21 7/10    Time 8    Period Weeks    Status New                   Plan - 03/26/21 0827     Clinical Impression Statement PT continued therex progression for increased lateral strengthening and stability, with added focus on eccentric control and landing mechanics. patient is reporting less pain with regular runing regiman and uphill hiking; with cheif complaint of pain with downhill hiking- providing rationale for this. Patient is able to comply with all cuing for proper technique of therex with increased LE cotrol with decreased knee valgus moment with cuing, responding very well to visual cuing of mirror. PT continued manual techniques for increased blood flow and tissue healing to IT band, and to decrease tension at glute min, med, and TFL. PT will continue progression as able.    Personal Factors and Comorbidities Past/Current Experience;Time since onset of injury/illness/exacerbation;Fitness    Examination-Activity Limitations Lift;Stairs;Sit    Examination-Participation Restrictions Community Activity;Driving    Stability/Clinical Decision Making Evolving/Moderate complexity    Clinical Decision Making Moderate     Rehab Potential Good    PT Frequency 2x / week    PT Duration 8 weeks    PT Treatment/Interventions ADLs/Self Care Home Management;Stair training;Therapeutic exercise;Passive range of motion;Electrical Stimulation;Moist Heat;Gait training;Therapeutic activities;Neuromuscular re-education;Balance training;Functional mobility training;Traction;Ultrasound;Dry needling;Joint Manipulations;Spinal Manipulations;Taping;Manual techniques    PT Next Visit Plan SL heel raise test, SL squat test    PT Home Exercise Plan isometric hip  hike, eccentric heel lower, heat application, activity modifications    Consulted and Agree with Plan of Care Patient             Patient will benefit from skilled therapeutic intervention in order to improve the following deficits and impairments:  Postural dysfunction, Abnormal gait, Decreased activity tolerance, Decreased endurance, Decreased strength, Increased fascial restricitons, Pain, Improper body mechanics, Decreased mobility, Decreased balance, Impaired flexibility, Impaired tone  Visit Diagnosis: Pain in right hip     Problem List Patient Active Problem List   Diagnosis Date Noted   Greater trochanteric pain syndrome of right lower extremity 03/11/2021   Strain of gastrocnemius muscle of left lower extremity 03/11/2021   Insomnia, persistent 03/14/2015   GAD (generalized anxiety disorder) 03/14/2015   Dermatitis, eczematoid 03/14/2015   Strict vegetarian diet 03/14/2015   Nonallopathic lesion of lumbosacral region 10/08/2014   Gluteal tendinitis of right buttock 08/21/2014   Durwin Reges DPT Durwin Reges, PT 03/26/2021, 8:33 AM  [place] [facility] [address] [place], [address] Phone: [phone]   Fax:  [phone]  Name: Christine Ball MRN: [id number] Date of Birth: 04/14/1981

## 2021-03-26 ENCOUNTER — Encounter: Payer: 59 | Admitting: Physical Therapy

## 2021-03-27 ENCOUNTER — Ambulatory Visit: Payer: 59 | Admitting: Physical Therapy

## 2021-03-27 ENCOUNTER — Encounter: Payer: Self-pay | Admitting: Physical Therapy

## 2021-03-27 DIAGNOSIS — M25551 Pain in right hip: Secondary | ICD-10-CM

## 2021-03-27 NOTE — Therapy (Signed)
[place] [facility] [address] [place], [address] Phone: [phone]   Fax:  [phone]  Physical Therapy Treatment  Patient Details  Name: Christine Ball MRN: [id number] Date of Birth: 1980-07-22 No data recorded  Encounter Date: 03/27/2021   PT End of Session - 03/27/21 0912     Visit Number 5    Number of Visits 17    Date for PT Re-Evaluation 05/09/21    PT Start Time 0902    PT Stop Time 0945    PT Time Calculation (min) 43 min    Activity Tolerance Patient tolerated treatment well    Behavior During Therapy [facility] for tasks assessed/performed             Past Medical History:  Diagnosis Date   Anxiety    Chronic insomnia    Eczema    Right hip pain     Past Surgical History:  Procedure Laterality Date   WISDOM TOOTH EXTRACTION      There were no vitals filed for this visit.   Subjective Assessment - 03/27/21 0906     Subjective Patient reports she is sore right over greater trochanter and proximal ITB, reports this sore pain is 3/10. Reports doing well overall, did not run yesterday due to soreness and coming back to PT today.    Pertinent History Patient is a 41 year old female presenting with R hip pain over 10 years with recent exacerbation 02/26/21. Was running on flat concrete at 3rd mile of usual 71mile run (completed 3 days a week) and felt a pop at the calf (MRI demonstrates medial gastroc strain 03/05/21), and later felt R hip pain worse than usual. Was able to walk back home following this. Pain is at lateral hip is constant 2-3/10 and 7/10 at its worst. Pain is localized to greater trochanter with some radiation into superior buttock. Pain is worse with running over 34miles or more days a week than 3, sleeping and laying on R side, stairs, uphill walking, lateral movements, and running on alternative terrain. Patient walks daily 1-44miles walking large dog, and runs 3x/week 73miles.  Patient enjoys paddleboarding, reading, and is a mom to 2 teenagers. Enjoys running 5Ks with her family. Secondary L gastroc pain is improving with heat, rest, ibprofen with only 1/10 with aggravating movements (stairs, hill walking, feeling off balance). Has had several bouts of PT in the past unsuccessful with TENs, active pump, lateral strenthening, but she has not completed HEP since last bout of PT. Pt denies N/V, B&B changes, unexplained weight fluctuation, saddle paresthesia, fever, night sweats, or unrelenting night pain at this time.    Limitations Sitting    How long can you sit comfortably? 30-51min    How long can you stand comfortably? unlimited    How long can you walk comfortably? unilimited    Diagnostic tests MRI of gastroc grade 1 strain; MRI of r hip    Patient Stated Goals run without pain    Pain Onset More than a month ago            Manual STM with trigger point release to glute med and glute max over deep rotators bilat Dry Needling: (2/2) 27mm .25 needles placed along the R superior glute gibers with target of glute min, and over deep rotators with target of piriformis bilat to decrease increased muscular spasms and trigger points with the patient positioned in prone. Patient was educated on risks  and benefits of therapy and verbally consents to PT.      Ther-Ex Nustep L 5 5min warmup with cuing for RPM between 60-70 Alt lateral lunge 2x 12 (6 each direction) with good carry over of initial cuing for technique/ wt acceptance Alt lateral jump 2x 8 (4 each side) with cuing for 2sec hold in landing Hip hike + CLLE swing 2x 15 bilat; on large dina disc with min cuing for set up posture Glute/piriformis stretch x30sec bilat                              PT Education - 03/27/21 0911     Education Details therex form/technique, TDN    Person(s) Educated Patient    Methods Explanation;Demonstration;Verbal cues    Comprehension Verbalized  understanding;Verbal cues required;Returned demonstration              PT Short Term Goals - 03/13/21 1656       PT SHORT TERM GOAL #1   Title Pt will be independent with HEP in order to improve strength and decrease back pain in order to improve pain-free function at home and work.    Baseline 03/13/21 HEP given    Time 4    Period Weeks    Status New               PT Long Term Goals - 03/26/21 0830       PT LONG TERM GOAL #1   Title Patient will increase FOTO score to 80 to demonstrate predicted increase in functional mobility to complete ADLs    Baseline 03/13/21 70    Time 8    Period Weeks    Status New      PT LONG TERM GOAL #2   Title Pt will decrease worst hip pain as reported on NPRS by at least 2 points in order to demonstrate clinically significant reduction in back pain.    Baseline 03/13/21 7/10    Time 8    Period Weeks    Status New                   Plan - 03/27/21 0917     Clinical Impression Statement PT continued therex progression for lateral strength, stability, and power, with regression of intensity and a pause from eccetric loading exercises d/t increased muscle soreness and fatigue following last session. PT allowed for increased rest time between therex, and utilized TDN and post exercise stretching to mitigate DOMS. PT also educated patient on the importance of sleep hygiene, nutrition, and hydration on recovery with understanding. Pt is motivated throughout session and is able to demosntrate good carry over of all cuing throughout session. Demonstrates decreased landing stability on RLE, but does attempt to correct this with ankle/hip strategies. PT will continue therex progression as able.    Personal Factors and Comorbidities Past/Current Experience;Time since onset of injury/illness/exacerbation;Fitness    Examination-Activity Limitations Lift;Stairs;Sit    Examination-Participation Restrictions Community Activity;Driving     Stability/Clinical Decision Making Evolving/Moderate complexity    Clinical Decision Making Moderate    Rehab Potential Good    PT Frequency 2x / week    PT Duration 8 weeks    PT Treatment/Interventions ADLs/Self Care Home Management;Stair training;Therapeutic exercise;Passive range of motion;Electrical Stimulation;Moist Heat;Gait training;Therapeutic activities;Neuromuscular re-education;Balance training;Functional mobility training;Traction;Ultrasound;Dry needling;Joint Manipulations;Spinal Manipulations;Taping;Manual techniques    PT Next Visit Plan SL heel raise test, SL squat test  PT Home Exercise Plan isometric hip hike, eccentric heel lower, heat application, activity modifications    Consulted and Agree with Plan of Care Patient             Patient will benefit from skilled therapeutic intervention in order to improve the following deficits and impairments:  Postural dysfunction, Abnormal gait, Decreased activity tolerance, Decreased endurance, Decreased strength, Increased fascial restricitons, Pain, Improper body mechanics, Decreased mobility, Decreased balance, Impaired flexibility, Impaired tone  Visit Diagnosis: Pain in right hip     Problem List Patient Active Problem List   Diagnosis Date Noted   Greater trochanteric pain syndrome of right lower extremity 03/11/2021   Strain of gastrocnemius muscle of left lower extremity 03/11/2021   Insomnia, persistent 03/14/2015   GAD (generalized anxiety disorder) 03/14/2015   Dermatitis, eczematoid 03/14/2015   Strict vegetarian diet 03/14/2015   Nonallopathic lesion of lumbosacral region 10/08/2014   Gluteal tendinitis of right buttock 08/21/2014   Durwin Reges DPT Durwin Reges, PT 03/27/2021, 10:11 AM  [place] [facility] [address], [place], [address] Phone: [phone]   Fax:  [phone]  Name: Christine Ball MRN: [id number] Date  of Birth: 1980-07-12

## 2021-03-31 ENCOUNTER — Ambulatory Visit: Payer: 59 | Admitting: Physical Therapy

## 2021-03-31 ENCOUNTER — Encounter: Payer: Self-pay | Admitting: Physical Therapy

## 2021-03-31 DIAGNOSIS — M25551 Pain in right hip: Secondary | ICD-10-CM

## 2021-03-31 NOTE — Therapy (Signed)
[facility] [address], [place], [address] Phone: [phone]   Fax:  [phone]  Physical Therapy Treatment  Patient Details  Name: Christine Ball MRN: [id number] Date of Birth: 01/11/1981 No data recorded  Encounter Date: 03/31/2021   PT End of Session - 03/31/21 0913     Visit Number 6    Number of Visits 17    Date for PT Re-Evaluation 05/09/21    PT Start Time 0900    PT Stop Time 0938    PT Time Calculation (min) 38 min    Activity Tolerance Patient tolerated treatment well    Behavior During Therapy [facility] for tasks assessed/performed             Past Medical History:  Diagnosis Date   Anxiety    Chronic insomnia    Eczema    Right hip pain     Past Surgical History:  Procedure Laterality Date   WISDOM TOOTH EXTRACTION      There were no vitals filed for this visit.   Subjective Assessment - 03/31/21 0903     Subjective Patient reports soreness to the outside of the hip reporting that it is constant and she is describing this as the pain she usually feels. Reports she is stretching and this temporarily feels good, but does not take away throbbing pain.    Pertinent History Patient is a 40 year old female presenting with R hip pain over 10 years with recent exacerbation 02/26/21. Was running on flat concrete at 3rd mile of usual 45mile run (completed 3 days a week) and felt a pop at the calf (MRI demonstrates medial gastroc strain 03/05/21), and later felt R hip pain worse than usual. Was able to walk back home following this. Pain is at lateral hip is constant 2-3/10 and 7/10 at its worst. Pain is localized to greater trochanter with some radiation into superior buttock. Pain is worse with running over 93miles or more days a week than 3, sleeping and laying on R side, stairs, uphill walking, lateral movements, and running on alternative terrain. Patient walks daily 1-75miles walking large  dog, and runs 3x/week 61miles. Patient enjoys paddleboarding, reading, and is a mom to 2 teenagers. Enjoys running 5Ks with her family. Secondary L gastroc pain is improving with heat, rest, ibprofen with only 1/10 with aggravating movements (stairs, hill walking, feeling off balance). Has had several bouts of PT in the past unsuccessful with TENs, active pump, lateral strenthening, but she has not completed HEP since last bout of PT. Pt denies N/V, B&B changes, unexplained weight fluctuation, saddle paresthesia, fever, night sweats, or unrelenting night pain at this time.    Limitations Sitting    How long can you sit comfortably? 30-55min    How long can you stand comfortably? unlimited    How long can you walk comfortably? unilimited    Diagnostic tests MRI of gastroc grade 1 strain; MRI of r hip    Patient Stated Goals run without pain               Manual R hipSTM with trigger point release to glute med and glute max over deep rotators    Ther-Ex Nustep L 5 45min warmup with cuing for RPM between 60-70 Standing on foam 30sec bilat with increased postural sway with RLE Standing on foam RLE drawing large alphabet with both UEs (reaching outside BOS) Standing on foam weighted ball toss to rebounder  x10 bilat with a couple instances   ESTIM + icepack [employer] ESTIM 10 min at patient tolerated 8.5V increased to 11V through treatment proximal ITB insertion .  With PT assessing patient tolerance throughout (increasing intensity as needed), monitoring skin integrity (normal), with decreased pain noted from patient                             PT Education - 03/31/21 0912     Education Details therex form/technique    Person(s) Educated Patient    Methods Explanation;Demonstration;Verbal cues    Comprehension Verbalized understanding;Verbal cues required;Returned demonstration              PT Short Term Goals - 03/13/21 1656       PT SHORT TERM GOAL #1    Title Pt will be independent with HEP in order to improve strength and decrease back pain in order to improve pain-free function at home and work.    Baseline 03/13/21 HEP given    Time 4    Period Weeks    Status New               PT Long Term Goals - 03/26/21 0830       PT LONG TERM GOAL #1   Title Patient will increase FOTO score to 80 to demonstrate predicted increase in functional mobility to complete ADLs    Baseline 03/13/21 70    Time 8    Period Weeks    Status New      PT LONG TERM GOAL #2   Title Pt will decrease worst hip pain as reported on NPRS by at least 2 points in order to demonstrate clinically significant reduction in back pain.    Baseline 03/13/21 7/10    Time 8    Period Weeks    Status New                   Plan - 03/31/21 0953     Clinical Impression Statement Pt with increased soreness this visit, disallowing therex progression. PT utilized isometric strengthening exercises to prevent to reduce soreness/pain from moving through lateral ROM. PT utilized manual techniques, with introduction of ice + ESTIM to reduce pain, with patient reporting 1/10 pain following. PT will continue rpogression as able tnad pain management as needed.    Personal Factors and Comorbidities Past/Current Experience;Time since onset of injury/illness/exacerbation;Fitness    Examination-Activity Limitations Lift;Stairs;Sit    Examination-Participation Restrictions Community Activity;Driving    Stability/Clinical Decision Making Evolving/Moderate complexity    Clinical Decision Making Moderate    Rehab Potential Good    PT Frequency 2x / week    PT Duration 8 weeks    PT Treatment/Interventions ADLs/Self Care Home Management;Stair training;Therapeutic exercise;Passive range of motion;Electrical Stimulation;Moist Heat;Gait training;Therapeutic activities;Neuromuscular re-education;Balance training;Functional mobility training;Traction;Ultrasound;Dry needling;Joint  Manipulations;Spinal Manipulations;Taping;Manual techniques    PT Next Visit Plan SL heel raise test, SL squat test    PT Home Exercise Plan isometric hip hike, eccentric heel lower, heat application, activity modifications    Consulted and Agree with Plan of Care Patient             Patient will benefit from skilled therapeutic intervention in order to improve the following deficits and impairments:  Postural dysfunction, Abnormal gait, Decreased activity tolerance, Decreased endurance, Decreased strength, Increased fascial restricitons, Pain, Improper body mechanics, Decreased mobility, Decreased balance, Impaired flexibility, Impaired tone  Visit Diagnosis: Pain in right  hip     Problem List Patient Active Problem List   Diagnosis Date Noted   Greater trochanteric pain syndrome of right lower extremity 03/11/2021   Strain of gastrocnemius muscle of left lower extremity 03/11/2021   Insomnia, persistent 03/14/2015   GAD (generalized anxiety disorder) 03/14/2015   Dermatitis, eczematoid 03/14/2015   Strict vegetarian diet 03/14/2015   Nonallopathic lesion of lumbosacral region 10/08/2014   Gluteal tendinitis of right buttock 08/21/2014   Durwin Reges DPT Durwin Reges, PT 03/31/2021, 3:25 PM  [facility] [address], [place], [address] Phone: [phone]   Fax:  [phone]  Name: Christine Ball MRN: [id number] Date of Birth: 1981/02/24

## 2021-04-02 ENCOUNTER — Encounter: Payer: Self-pay | Admitting: Physical Therapy

## 2021-04-02 ENCOUNTER — Ambulatory Visit: Payer: 59 | Admitting: Physical Therapy

## 2021-04-02 DIAGNOSIS — M25551 Pain in right hip: Secondary | ICD-10-CM

## 2021-04-02 NOTE — Therapy (Signed)
[facility] [address], [place], [address] Phone: [phone]   Fax:  [phone]  Physical Therapy Treatment  Patient Details  Name: Christine Ball MRN: [id number] Date of Birth: 1981/03/22 No data recorded  Encounter Date: 04/02/2021   PT End of Session - 04/02/21 0912     Visit Number 7    Number of Visits 17    Date for PT Re-Evaluation 05/09/21    PT Start Time 0902    PT Stop Time 0940    PT Time Calculation (min) 38 min    Activity Tolerance Patient tolerated treatment well    Behavior During Therapy [facility] for tasks assessed/performed             Past Medical History:  Diagnosis Date   Anxiety    Chronic insomnia    Eczema    Right hip pain     Past Surgical History:  Procedure Laterality Date   WISDOM TOOTH EXTRACTION      There were no vitals filed for this visit.   Subjective Assessment - 04/02/21 0904     Subjective Pt reports she feels topically sore from the ESTIM, and reports her hip feels "angrier overall". She is having her normal soreness as well. Reports her pain is a 3/10 currently at lateral hip. Did run after last PT session (3 miles instead of 4) did not have increased pain throughout run but non-improved pain following, and minimal complianceHEP stretching. Has not tried ice or heat for pain.    Pertinent History Patient is a 39 year old female presenting with R hip pain over 10 years with recent exacerbation 02/26/21. Was running on flat concrete at 3rd mile of usual 50mile run (completed 3 days a week) and felt a pop at the calf (MRI demonstrates medial gastroc strain 03/05/21), and later felt R hip pain worse than usual. Was able to walk back home following this. Pain is at lateral hip is constant 2-3/10 and 7/10 at its worst. Pain is localized to greater trochanter with some radiation into superior buttock. Pain is worse with running over 56miles or more days a week  than 3, sleeping and laying on R side, stairs, uphill walking, lateral movements, and running on alternative terrain. Patient walks daily 1-69miles walking large dog, and runs 3x/week 72miles. Patient enjoys paddleboarding, reading, and is a mom to 2 teenagers. Enjoys running 5Ks with her family. Secondary L gastroc pain is improving with heat, rest, ibprofen with only 1/10 with aggravating movements (stairs, hill walking, feeling off balance). Has had several bouts of PT in the past unsuccessful with TENs, active pump, lateral strenthening, but she has not completed HEP since last bout of PT. Pt denies N/V, B&B changes, unexplained weight fluctuation, saddle paresthesia, fever, night sweats, or unrelenting night pain at this time.    Limitations Sitting    How long can you sit comfortably? 30-92min    How long can you stand comfortably? unlimited    How long can you walk comfortably? unilimited    Diagnostic tests MRI of gastroc grade 1 strain; MRI of r hip    Patient Stated Goals run without pain    Pain Onset More than a month ago             Manual STM with trigger point release to glute min/superior glute max, and TFL Dry Needling: (2/2/2) 140mm .30 needles placed along the R glute min and mid glute  max over piriformis with pt in prone; and to TFL with patient in sidelying to decrease increased muscular spasms and trigger points.. Patient was educated on risks and benefits of therapy and verbally consents to PT.  Manual obers stretch 30sec hold   Ther-Ex Nustep L 5 70min warmup with cuing for RPM between 60-70 SLS on foam finger tap to 6in cone on floor in front of patient 2x 6/8 bilat with good carry over of initial demo  Standing on foam 30sec bilat with increased postural sway with RLE Step up onto bosu (hardside) with CLLE hip flex 2x 8 with good carry over of demo                PT Education - 04/02/21 0909     Education Details therex form/techinque    Person(s)  Educated Patient    Methods Explanation;Demonstration;Verbal cues    Comprehension Verbalized understanding;Returned demonstration;Verbal cues required              PT Short Term Goals - 03/13/21 1656       PT SHORT TERM GOAL #1   Title Pt will be independent with HEP in order to improve strength and decrease back pain in order to improve pain-free function at home and work.    Baseline 03/13/21 HEP given    Time 4    Period Weeks    Status New               PT Long Term Goals - 03/26/21 0830       PT LONG TERM GOAL #1   Title Patient will increase FOTO score to 80 to demonstrate predicted increase in functional mobility to complete ADLs    Baseline 03/13/21 70    Time 8    Period Weeks    Status New      PT LONG TERM GOAL #2   Title Pt will decrease worst hip pain as reported on NPRS by at least 2 points in order to demonstrate clinically significant reduction in back pain.    Baseline 03/13/21 7/10    Time 8    Period Weeks    Status New                   Plan - 04/02/21 1017     Clinical Impression Statement PT continued therex progression with stability focus to reduce post therex soreness. PT forewent ESTIM as patient did not have benefit from this last session. PT utilized manual with TDN techniques to reduce muscle tension and pain with good localized twitch responses, and patient subjectively reporting pinpoint tenderness to TFL, reporting she thinks this might have been the area of discomfort. PT edcuated patient to forgo therex and full running regimen at home for the next 2-3 days, utilizing ice and heat and stretching for pain reduction with understanding. PT will ocntinue progression as able.    Personal Factors and Comorbidities Past/Current Experience;Time since onset of injury/illness/exacerbation;Fitness    Examination-Activity Limitations Lift;Stairs;Sit    Examination-Participation Restrictions Community Activity;Driving     Stability/Clinical Decision Making Evolving/Moderate complexity    Clinical Decision Making Moderate    Rehab Potential Good    PT Frequency 2x / week    PT Duration 8 weeks    PT Treatment/Interventions ADLs/Self Care Home Management;Stair training;Therapeutic exercise;Passive range of motion;Electrical Stimulation;Moist Heat;Gait training;Therapeutic activities;Neuromuscular re-education;Balance training;Functional mobility training;Traction;Ultrasound;Dry needling;Joint Manipulations;Spinal Manipulations;Taping;Manual techniques    PT Next Visit Plan SL heel raise test, SL squat test  PT Home Exercise Plan isometric hip hike, eccentric heel lower, heat application, activity modifications    Consulted and Agree with Plan of Care Patient             Patient will benefit from skilled therapeutic intervention in order to improve the following deficits and impairments:  Postural dysfunction, Abnormal gait, Decreased activity tolerance, Decreased endurance, Decreased strength, Increased fascial restricitons, Pain, Improper body mechanics, Decreased mobility, Decreased balance, Impaired flexibility, Impaired tone  Visit Diagnosis: Pain in right hip     Problem List Patient Active Problem List   Diagnosis Date Noted   Greater trochanteric pain syndrome of right lower extremity 03/11/2021   Strain of gastrocnemius muscle of left lower extremity 03/11/2021   Insomnia, persistent 03/14/2015   GAD (generalized anxiety disorder) 03/14/2015   Dermatitis, eczematoid 03/14/2015   Strict vegetarian diet 03/14/2015   Nonallopathic lesion of lumbosacral region 10/08/2014   Gluteal tendinitis of right buttock 08/21/2014   Durwin Reges DPT Durwin Reges, PT 04/02/2021, 10:39 AM  [facility] [address] [place], [address] Phone: [phone]   Fax:  [phone]  Name: TRENISE TURAY MRN: [id number] Date  of Birth: July 31, 1980

## 2021-04-04 ENCOUNTER — Encounter: Payer: Self-pay | Admitting: Family Medicine

## 2021-04-04 ENCOUNTER — Ambulatory Visit: Payer: 59 | Admitting: Family Medicine

## 2021-04-04 ENCOUNTER — Other Ambulatory Visit: Payer: Self-pay

## 2021-04-04 ENCOUNTER — Inpatient Hospital Stay (INDEPENDENT_AMBULATORY_CARE_PROVIDER_SITE_OTHER): Payer: 59 | Admitting: Radiology

## 2021-04-04 VITALS — BP 116/82 | HR 72 | Ht 65.0 in | Wt 142.0 lb

## 2021-04-04 DIAGNOSIS — M25551 Pain in right hip: Secondary | ICD-10-CM

## 2021-04-04 MED ORDER — DICLOFENAC SODIUM 75 MG PO TBEC
75.0000 mg | DELAYED_RELEASE_TABLET | Freq: Two times a day (BID) | ORAL | 0 refills | Status: DC | PRN
Start: 2021-04-04 — End: 2021-05-20
  Filled 2021-04-04: qty 60, 30d supply, fill #0

## 2021-04-04 MED ORDER — TRIAMCINOLONE ACETONIDE 40 MG/ML IJ SUSP
40.0000 mg | Freq: Once | INTRAMUSCULAR | Status: AC
  Administered 2021-04-04: 40 mg

## 2021-04-04 NOTE — Progress Notes (Addendum)
Primary Care / Sports Medicine Office Visit  Patient Information:  Patient ID: Christine Ball, female DOB: 07-08-1980 Age: 40 y.o. MRN: [id number]   Christine Ball is a pleasant 40 y.o. 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  [place] LIMITED JOINT SPACE STRUCTURES LOW RIGHT  Result Date: 04/04/2021 Procedure:  Injection of right greater trochanteric region under ultrasound guidance. Ultrasound guidance utilized for out of plane approach, visualization of the greater trochanter and insertional tendons, confirmation of injectate noted Samsung HS60 device utilized with permanent recording / reporting. Consent obtained and verified. Skin prepped in a sterile fashion. Ethyl chloride spray for topical local analgesia. Completed without difficulty and tolerated well. Medication: triamcinolone acetonide 40 mg/mL suspension for injection 1 mL total and 2 mL lidocaine 1% without epinephrine utilized for needle placement anesthetic Advised to contact for fevers/chills, erythema, induration, drainage, or persistent bleeding.    Independent interpretation of notes and tests performed by another provider:   None  Procedures performed:   Procedure:  Injection of  right greater trochanteric region under ultrasound guidance. Ultrasound guidance utilized for out of plane approach, visualization of the greater trochanter and insertional tendons, confirmation of injectate noted Samsung HS60 device utilized with permanent recording / reporting. Consent obtained and verified. Skin prepped in a sterile fashion. Ethyl chloride spray for topical local analgesia.  Completed without difficulty and tolerated well. Medication: triamcinolone acetonide 40 mg/mL suspension for injection 1 mL total and 2 mL lidocaine 1% without epinephrine utilized for needle placement anesthetic Advised to contact for fevers/chills, erythema, induration, drainage, or persistent bleeding.   Pertinent History, Exam, Impression, and Recommendations:   Greater trochanteric pain syndrome of right lower extremity Chronic issue with persistent symptomatology despite adherence to physical therapy, where she received dry needling, meloxicam, and activity modification.  Given her focal findings we reviewed additional treatment strategies and she is elected proceed with ultrasound-guided greater trochanteric bursa injection.  Post care reviewed and patient to transition to diclofenac on a as needed basis, continue with physical therapy, activity modification, and maintain close follow-up in 6 weeks.  If suboptimal response noted despite adherence to the above, anticipate advanced imaging versus continued pharmacotherapy change/additional injections if indicated.   Orders & Medications Meds ordered this encounter  Medications   diclofenac (VOLTAREN) 75 MG EC tablet    Sig: Take 1 tablet (75 mg total) by mouth 2 (two) times daily as needed.    Dispense:  60 tablet    Refill:  0   triamcinolone acetonide (KENALOG-40) injection 40 mg   Orders Placed This Encounter  Procedures   [place] LIMITED JOINT SPACE STRUCTURES LOW RIGHT     Return in about 6 weeks (around 05/16/2021).  Montel Culver,  MD   [facility]

## 2021-04-04 NOTE — Assessment & Plan Note (Signed)
Chronic issue with persistent symptomatology despite adherence to physical therapy, where she received dry needling, meloxicam, and activity modification.  Given her focal findings we reviewed additional treatment strategies and she is elected proceed with ultrasound-guided greater trochanteric bursa injection.  Post care reviewed and patient to transition to diclofenac on a as needed basis, continue with physical therapy, activity modification, and maintain close follow-up in 6 weeks.  If suboptimal response noted despite adherence to the above, anticipate advanced imaging versus continued pharmacotherapy change/additional injections if indicated.

## 2021-04-04 NOTE — Patient Instructions (Addendum)
You have just been given a cortisone injection to reduce pain and inflammation. After the injection you may notice immediate relief of pain as a result of the Lidocaine. It is important to rest the area of the injection for 24 to 48 hours after the injection. There is a possibility of some temporary increased discomfort and swelling for up to 72 hours until the cortisone begins to work. If you do have pain, simply rest the joint and use ice. If you can tolerate over the counter medications, you can try Tylenol, Aleve, or Advil for added relief per package instructions. - Hold from running until Sunday - Stop meloxicam - Start diclofenac twice daily on an as-needed basis (take with food) - Continue with physical therapy and home exercises - Return for follow-up in 6 weeks

## 2021-04-08 ENCOUNTER — Ambulatory Visit: Payer: 59 | Admitting: Physical Therapy

## 2021-04-08 ENCOUNTER — Encounter: Payer: Self-pay | Admitting: Physical Therapy

## 2021-04-08 DIAGNOSIS — M25551 Pain in right hip: Secondary | ICD-10-CM

## 2021-04-08 NOTE — Therapy (Signed)
[facility] [address], [place], [address] Phone: [phone]   Fax:  [phone]  Physical Therapy Treatment  Patient Details  Name: Christine Ball MRN: [id number] Date of Birth: April 11, 1981 No data recorded  Encounter Date: 04/08/2021   PT End of Session - 04/08/21 1051     Visit Number 8    Number of Visits 17    Date for PT Re-Evaluation 05/09/21    PT Start Time 1030    PT Stop Time 1110    PT Time Calculation (min) 40 min    Activity Tolerance Patient tolerated treatment well    Behavior During Therapy [facility] for tasks assessed/performed             Past Medical History:  Diagnosis Date   Anxiety    Chronic insomnia    Eczema    Right hip pain     Past Surgical History:  Procedure Laterality Date   WISDOM TOOTH EXTRACTION      There were no vitals filed for this visit.   Subjective Assessment - 04/08/21 1037     Subjective Pt reports she felt good following TDN, no pain. She reports she had a cortisone shot Friday and just felt like her hips were "unstable" following this. She went for a run yesterday for the first time since last visit and felt sore following 3-4/`0 pain, which she still has currently.    Pertinent History Patient is a 40 year old female presenting with R hip pain over 10 years with recent exacerbation 02/26/21. Was running on flat concrete at 3rd mile of usual 79mile run (completed 3 days a week) and felt a pop at the calf (MRI demonstrates medial gastroc strain 03/05/21), and later felt R hip pain worse than usual. Was able to walk back home following this. Pain is at lateral hip is constant 2-3/10 and 7/10 at its worst. Pain is localized to greater trochanter with some radiation into superior buttock. Pain is worse with running over 53miles or more days a week than 3, sleeping and laying on R side, stairs, uphill walking, lateral movements, and running on alternative  terrain. Patient walks daily 1-98miles walking large dog, and runs 3x/week 27miles. Patient enjoys paddleboarding, reading, and is a mom to 2 teenagers. Enjoys running 5Ks with her family. Secondary L gastroc pain is improving with heat, rest, ibprofen with only 1/10 with aggravating movements (stairs, hill walking, feeling off balance). Has had several bouts of PT in the past unsuccessful with TENs, active pump, lateral strenthening, but she has not completed HEP since last bout of PT. Pt denies N/V, B&B changes, unexplained weight fluctuation, saddle paresthesia, fever, night sweats, or unrelenting night pain at this time.    Limitations Sitting    How long can you sit comfortably? 30-4min    How long can you stand comfortably? unlimited    How long can you walk comfortably? unilimited    Diagnostic tests MRI of gastroc grade 1 strain; MRI of r hip    Patient Stated Goals run without pain    Pain Onset More than a month ago               Manual STM with trigger point release to glute min/superior glute max, and TFL Dry Needling: (2/2/2) 18mm .30 needles placed along the R glute min and mid glute max over piriformis with pt in prone; and to TFL with patient in sidelying to  decrease increased muscular spasms and trigger points.. Patient was educated on risks and benefits of therapy and verbally consents to PT.  Manual obers stretch 30sec hold   Ther-Ex Nustep L 5 77min warmup with cuing for RPM between 60-70 Alt lateral lunge 2x 10 with min cuing for technique with good carry over Step up onto bosu (hardside) with CLLE hip flex 2x 8 with good carry over of demo  SLS on foam finger tap to 6in cone on floor in front of patient 2x 6/8 bilat with good carry over of initial demo  Standing on foam 30sec bilat with increased postural sway with RLE                              PT Education - 04/08/21 1051     Education Details therex form/technique    Person(s)  Educated Patient    Methods Explanation;Demonstration;Verbal cues    Comprehension Verbalized understanding;Returned demonstration;Verbal cues required              PT Short Term Goals - 03/13/21 1656       PT SHORT TERM GOAL #1   Title Pt will be independent with HEP in order to improve strength and decrease back pain in order to improve pain-free function at home and work.    Baseline 03/13/21 HEP given    Time 4    Period Weeks    Status New               PT Long Term Goals - 03/26/21 0830       PT LONG TERM GOAL #1   Title Patient will increase FOTO score to 80 to demonstrate predicted increase in functional mobility to complete ADLs    Baseline 03/13/21 70    Time 8    Period Weeks    Status New      PT LONG TERM GOAL #2   Title Pt will decrease worst hip pain as reported on NPRS by at least 2 points in order to demonstrate clinically significant reduction in back pain.    Baseline 03/13/21 7/10    Time 8    Period Weeks    Status New                   Plan - 04/08/21 1343     Clinical Impression Statement PT continued therex progression for increased lateral stability and strength with success. PT continued to utilize manual with TDN for pain reduction, with patient reporting 1/10 pain following (4/10 pain prior). Patinet with markedly less tension and trigger points to glute max and min, with some tension at TFL noted. PT educated aptient on dynamic warmup, as patient did not have pain with anything other than run this weekend since cortisone shot. Patient verbalized understanding. PT also educated pt on pain science and pain as a multifactorial experience which patient is very accepting of. PT edcuated patient on the pain loop and lifestyle factors affecting chronic pain with excellent understanding. patient is motivated throughout session with compliance of all cuing. PT will continue progression as able.    Personal Factors and Comorbidities  Past/Current Experience;Time since onset of injury/illness/exacerbation;Fitness    Examination-Activity Limitations Lift;Stairs;Sit    Examination-Participation Restrictions Community Activity;Driving    Stability/Clinical Decision Making Evolving/Moderate complexity    Clinical Decision Making Moderate    Rehab Potential Good    PT Frequency 2x / week  PT Duration 8 weeks    PT Treatment/Interventions ADLs/Self Care Home Management;Stair training;Therapeutic exercise;Passive range of motion;Electrical Stimulation;Moist Heat;Gait training;Therapeutic activities;Neuromuscular re-education;Balance training;Functional mobility training;Traction;Ultrasound;Dry needling;Joint Manipulations;Spinal Manipulations;Taping;Manual techniques    PT Next Visit Plan SL heel raise test, SL squat test    PT Home Exercise Plan isometric hip hike, eccentric heel lower, heat application, activity modifications    Consulted and Agree with Plan of Care Patient             Patient will benefit from skilled therapeutic intervention in order to improve the following deficits and impairments:  Postural dysfunction, Abnormal gait, Decreased activity tolerance, Decreased endurance, Decreased strength, Increased fascial restricitons, Pain, Improper body mechanics, Decreased mobility, Decreased balance, Impaired flexibility, Impaired tone  Visit Diagnosis: Pain in right hip     Problem List Patient Active Problem List   Diagnosis Date Noted   Greater trochanteric pain syndrome of right lower extremity 03/11/2021   Strain of gastrocnemius muscle of left lower extremity 03/11/2021   Insomnia, persistent 03/14/2015   GAD (generalized anxiety disorder) 03/14/2015   Dermatitis, eczematoid 03/14/2015   Strict vegetarian diet 03/14/2015   Nonallopathic lesion of lumbosacral region 10/08/2014   Gluteal tendinitis of right buttock 08/21/2014   Durwin Reges DPT Durwin Reges, PT 04/08/2021, 2:04 PM  [facility] [address], [place], [address] Phone: [phone]   Fax:  [phone]  Name: Christine Ball MRN: [id number] Date of Birth: May 02, 1981

## 2021-04-14 ENCOUNTER — Ambulatory Visit: Payer: 59 | Admitting: Physical Therapy

## 2021-04-14 ENCOUNTER — Encounter: Payer: Self-pay | Admitting: Physical Therapy

## 2021-04-14 DIAGNOSIS — M25551 Pain in right hip: Secondary | ICD-10-CM

## 2021-04-14 NOTE — Therapy (Signed)
[facility] [address], [place], [address] Phone: [phone]   Fax:  [phone]  Physical Therapy Treatment  Patient Details  Name: Christine Ball MRN: [id number] Date of Birth: [birth year] No data recorded  Encounter Date: 04/14/2021   PT End of Session - 04/14/21 0909     Visit Number 9    Number of Visits 17    Date for PT Re-Evaluation 05/09/21    PT Start Time 0903    PT Stop Time 0945    PT Time Calculation (min) 42 min    Activity Tolerance Patient tolerated treatment well    Behavior During Therapy [facility] for tasks assessed/performed             Past Medical History:  Diagnosis Date   Anxiety    Chronic insomnia    Eczema    Right hip pain     Past Surgical History:  Procedure Laterality Date   WISDOM TOOTH EXTRACTION      There were no vitals filed for this visit.   Subjective Assessment - 04/14/21 0906     Subjective Pt reports she has felt less throbbing pain, does feel stronger when she is on one foot to pick something up. She reports her post run pain is more of a bruise feeling, and she has been completing a dynamic warm up. Reports pain currently is 1-2/10; reports 2-3/10 pain post run.    Pertinent History Patient is a 40 year old female presenting with R hip pain over 10 years with recent exacerbation 02/26/21. Was running on flat concrete at 3rd mile of usual 75mile run (completed 3 days a week) and felt a pop at the calf (MRI demonstrates medial gastroc strain 03/05/21), and later felt R hip pain worse than usual. Was able to walk back home following this. Pain is at lateral hip is constant 2-3/10 and 7/10 at its worst. Pain is localized to greater trochanter with some radiation into superior buttock. Pain is worse with running over 64miles or more days a week than 3, sleeping and laying on R side, stairs, uphill walking, lateral movements, and running on alternative terrain.  Patient walks daily 1-32miles walking large dog, and runs 3x/week 56miles. Patient enjoys paddleboarding, reading, and is a mom to 2 teenagers. Enjoys running 5Ks with her family. Secondary L gastroc pain is improving with heat, rest, ibprofen with only 1/10 with aggravating movements (stairs, hill walking, feeling off balance). Has had several bouts of PT in the past unsuccessful with TENs, active pump, lateral strenthening, but she has not completed HEP since last bout of PT. Pt denies N/V, B&B changes, unexplained weight fluctuation, saddle paresthesia, fever, night sweats, or unrelenting night pain at this time.    Limitations Sitting    How long can you sit comfortably? 30-77min    How long can you stand comfortably? unlimited    How long can you walk comfortably? unilimited    Diagnostic tests MRI of gastroc grade 1 strain; MRI of r hip    Patient Stated Goals run without pain    Pain Onset More than a month ago             Manual STM with trigger point release to glute min/superior glute max, and TFL Dry Needling: (2/2/2) 11mm .30 needles placed along the R glute min and mid glute max over piriformis with pt in prone; and to TFL with patient in sidelying to decrease  increased muscular spasms and trigger points.. Patient was educated on risks and benefits of therapy and verbally consents to PT.  Manual obers stretch 30sec hold   Ther-Ex Nustep L 5 45min warmup with cuing for RPM between 60-70 Lateral step up onto bosu (hardside) with CLLE hip flex 2x 8 (bilat) with good carry over of demo  Single leg RDL 10# 2x 8 (bilat) with min cuing for technique without hip rotation with good carry over Alt lunge jumps 2x 10 (5 each side) with good carry over of initial cuing for eccentric landing                           PT Education - 04/14/21 0909     Education Details therex form/technique    Person(s) Educated Patient    Methods Explanation;Demonstration;Verbal cues     Comprehension Verbalized understanding;Returned demonstration;Verbal cues required              PT Short Term Goals - 03/13/21 1656       PT SHORT TERM GOAL #1   Title Pt will be independent with HEP in order to improve strength and decrease back pain in order to improve pain-free function at home and work.    Baseline 03/13/21 HEP given    Time 4    Period Weeks    Status New               PT Long Term Goals - 03/26/21 0830       PT LONG TERM GOAL #1   Title Patient will increase FOTO score to 80 to demonstrate predicted increase in functional mobility to complete ADLs    Baseline 03/13/21 70    Time 8    Period Weeks    Status New      PT LONG TERM GOAL #2   Title Pt will decrease worst hip pain as reported on NPRS by at least 2 points in order to demonstrate clinically significant reduction in back pain.    Baseline 03/13/21 7/10    Time 8    Period Weeks    Status New                   Plan - 04/14/21 0917     Clinical Impression Statement PT continued therex progression for increased lateral hip and core stability to reduce lateral hip pain/increase strength/power needed for terrain running. PT continued to utilize TDN to reduce tension and pain of glute musculature and TFL with success, patient with marked less tension and trigger points to touch in these areas. PT will continue progression as able.    Personal Factors and Comorbidities Past/Current Experience;Time since onset of injury/illness/exacerbation;Fitness    Examination-Activity Limitations Lift;Stairs;Sit    Examination-Participation Restrictions Community Activity;Driving    Stability/Clinical Decision Making Evolving/Moderate complexity    Clinical Decision Making Moderate    Rehab Potential Good    PT Frequency 2x / week    PT Duration 8 weeks    PT Treatment/Interventions ADLs/Self Care Home Management;Stair training;Therapeutic exercise;Passive range of motion;Electrical  Stimulation;Moist Heat;Gait training;Therapeutic activities;Neuromuscular re-education;Balance training;Functional mobility training;Traction;Ultrasound;Dry needling;Joint Manipulations;Spinal Manipulations;Taping;Manual techniques    PT Next Visit Plan SL heel raise test, SL squat test    PT Home Exercise Plan isometric hip hike, eccentric heel lower, heat application, activity modifications    Consulted and Agree with Plan of Care Patient  Patient will benefit from skilled therapeutic intervention in order to improve the following deficits and impairments:  Postural dysfunction, Abnormal gait, Decreased activity tolerance, Decreased endurance, Decreased strength, Increased fascial restricitons, Pain, Improper body mechanics, Decreased mobility, Decreased balance, Impaired flexibility, Impaired tone  Visit Diagnosis: Pain in right hip     Problem List Patient Active Problem List   Diagnosis Date Noted   Greater trochanteric pain syndrome of right lower extremity 03/11/2021   Strain of gastrocnemius muscle of left lower extremity 03/11/2021   Insomnia, persistent 03/14/2015   GAD (generalized anxiety disorder) 03/14/2015   Dermatitis, eczematoid 03/14/2015   Strict vegetarian diet 03/14/2015   Nonallopathic lesion of lumbosacral region 10/08/2014   Gluteal tendinitis of right buttock 08/21/2014   Durwin Reges DPT Durwin Reges, PT 04/14/2021, 10:51 AM  [facility] [address] [place], [address] Phone: [phone]   Fax:  [phone]  Name: Christine Ball MRN: [id number] Date of Birth: 1981-03-10

## 2021-04-16 ENCOUNTER — Encounter: Payer: Self-pay | Admitting: Physical Therapy

## 2021-04-16 ENCOUNTER — Ambulatory Visit: Payer: 59 | Admitting: Physical Therapy

## 2021-04-16 DIAGNOSIS — M25551 Pain in right hip: Secondary | ICD-10-CM

## 2021-04-16 NOTE — Therapy (Signed)
[facility] [address], [place], [address] Phone: [phone]   Fax:  [phone]  Physical Therapy Treatment/Progress Note Reporting Period 03/13/21 - 04/16/21  Patient Details  Name: Christine Ball MRN: [id number] Date of Birth: 09/11/1980 No data recorded  Encounter Date: 04/16/2021   PT End of Session - 04/16/21 0914     Visit Number 10    Number of Visits 17    Date for PT Re-Evaluation 05/09/21    PT Start Time 0905    PT Stop Time 0945    PT Time Calculation (min) 40 min    Activity Tolerance Patient tolerated treatment well    Behavior During Therapy [facility] for tasks assessed/performed             Past Medical History:  Diagnosis Date   Anxiety    Chronic insomnia    Eczema    Right hip pain     Past Surgical History:  Procedure Laterality Date   WISDOM TOOTH EXTRACTION      There were no vitals filed for this visit.   Subjective Assessment - 04/16/21 0909     Subjective Pt reports recently she has had more post exercise soreness in her posterior glute than pain in the lateral hip which has been an improvement. Reports no pain today, and that she has forgotten to take medication over the past couple of days.    Pertinent History Patient is a 40 year old female presenting with R hip pain over 10 years with recent exacerbation 02/26/21. Was running on flat concrete at 3rd mile of usual 13mile run (completed 3 days a week) and felt a pop at the calf (MRI demonstrates medial gastroc strain 03/05/21), and later felt R hip pain worse than usual. Was able to walk back home following this. Pain is at lateral hip is constant 2-3/10 and 7/10 at its worst. Pain is localized to greater trochanter with some radiation into superior buttock. Pain is worse with running over 26miles or more days a week than 3, sleeping and laying on R side, stairs, uphill walking, lateral movements, and running on  alternative terrain. Patient walks daily 1-10miles walking large dog, and runs 3x/week 43miles. Patient enjoys paddleboarding, reading, and is a mom to 2 teenagers. Enjoys running 5Ks with her family. Secondary L gastroc pain is improving with heat, rest, ibprofen with only 1/10 with aggravating movements (stairs, hill walking, feeling off balance). Has had several bouts of PT in the past unsuccessful with TENs, active pump, lateral strenthening, but she has not completed HEP since last bout of PT. Pt denies N/V, B&B changes, unexplained weight fluctuation, saddle paresthesia, fever, night sweats, or unrelenting night pain at this time.    Limitations Sitting    How long can you sit comfortably? 30-33min    How long can you stand comfortably? unlimited    How long can you walk comfortably? unilimited    Diagnostic tests MRI of gastroc grade 1 strain; MRI of r hip    Patient Stated Goals run without pain                Manual STM with trigger point release to glute min/superior glute max, and TFL Dry Needling: (2/2/2) 134mm .30 needles placed along the R glute min and mid glute max over piriformis with pt in prone; and to TFL with patient in sidelying to decrease increased muscular spasms and trigger points.. Patient was educated on  risks and benefits of therapy and verbally consents to PT.     Ther-Ex Nustep L 5 85min warmup with cuing for RPM between 60-70 MATRIX hip ext 40# 2x 10 bilat with min cuing for initial technique with good carry over Step up onto 6in step w/ CLLE hip flex 2x 6/8 with focus on hip ext activiation with good carry over Hip hike + CLLE FWD <> BWD swings 2x 12 bilat                          PT Education - 04/16/21 0912     Education Details therex form/technique    Person(s) Educated Patient    Methods Explanation;Demonstration;Verbal cues    Comprehension Verbalized understanding;Returned demonstration;Verbal cues required               PT Short Term Goals - 04/16/21 0915       PT SHORT TERM GOAL #1   Title Pt will be independent with HEP in order to improve strength and decrease back pain in order to improve pain-free function at home and work.    Baseline 03/13/21 HEP given; 04/16/21 completing HEP without question/concern    Time 4    Period Weeks    Status Achieved               PT Long Term Goals - 04/16/21 0914       PT LONG TERM GOAL #1   Title Patient will increase FOTO score to 80 to demonstrate predicted increase in functional mobility to complete ADLs    Baseline 03/13/21 70; 04/16/21 73    Time 8    Period Weeks    Status On-going      PT LONG TERM GOAL #2   Title Pt will decrease worst hip pain as reported on NPRS by at least 2 points in order to demonstrate clinically significant reduction in back pain.    Baseline 03/13/21 7/10; 04/16/21 3.5/10    Time 8    Period Weeks    Status Achieved                   Plan - 04/16/21 6160     Clinical Impression Statement PT completed reassessment today where patient has made great improvement in pain (reported from NPRS), and recent improvement of no lateral hip pain, with consistent pain score prior to today of 2-3/10. Objectively patient demonstrates much better motor control and body awareness of therex with neutral hip/knee/ankle alignment, good self correction of therex, and less sway and hip/ankle strategy in SLS positions. PT continued therex progression for increased hip ext strength, holding off lateral movements this date to decrease post exercise soreness prior to race Saturday. Plan to d/c patient to a robust HEP for mainteance in the next couple weeks depending on progress with 5K race this Saturday. PT will continue to benefit from skilled PT to address FOTO goal of subjective function and to decrease pain post running.    Personal Factors and Comorbidities Past/Current Experience;Time since onset of  injury/illness/exacerbation;Fitness    Examination-Activity Limitations Lift;Stairs;Sit    Examination-Participation Restrictions Community Activity;Driving    Stability/Clinical Decision Making Evolving/Moderate complexity    Clinical Decision Making Moderate    Rehab Potential Good    PT Frequency 2x / week    PT Duration 8 weeks    PT Treatment/Interventions ADLs/Self Care Home Management;Stair training;Therapeutic exercise;Passive range of motion;Electrical Stimulation;Moist Heat;Gait training;Therapeutic activities;Neuromuscular re-education;Balance training;Functional mobility  training;Traction;Ultrasound;Dry needling;Joint Manipulations;Spinal Manipulations;Taping;Manual techniques    PT Next Visit Plan SL heel raise test, SL squat test    PT Home Exercise Plan isometric hip hike, eccentric heel lower, heat application, activity modifications    Consulted and Agree with Plan of Care Patient             Patient will benefit from skilled therapeutic intervention in order to improve the following deficits and impairments:  Postural dysfunction, Abnormal gait, Decreased activity tolerance, Decreased endurance, Decreased strength, Increased fascial restricitons, Pain, Improper body mechanics, Decreased mobility, Decreased balance, Impaired flexibility, Impaired tone  Visit Diagnosis: Pain in right hip     Problem List Patient Active Problem List   Diagnosis Date Noted   Greater trochanteric pain syndrome of right lower extremity 03/11/2021   Strain of gastrocnemius muscle of left lower extremity 03/11/2021   Insomnia, persistent 03/14/2015   GAD (generalized anxiety disorder) 03/14/2015   Dermatitis, eczematoid 03/14/2015   Strict vegetarian diet 03/14/2015   Nonallopathic lesion of lumbosacral region 10/08/2014   Gluteal tendinitis of right buttock 08/21/2014   Durwin Reges DPT Durwin Reges, PT 04/16/2021, 10:03 AM  [place]  [facility] [address] [place], [address] Phone: [phone]   Fax:  [phone]  Name: Christine Ball MRN: [id number] Date of Birth: 10-15-1980

## 2021-04-21 ENCOUNTER — Encounter: Payer: 59 | Admitting: Physical Therapy

## 2021-04-22 ENCOUNTER — Encounter: Payer: Self-pay | Admitting: Physical Therapy

## 2021-04-22 ENCOUNTER — Ambulatory Visit: Payer: 59 | Attending: Family Medicine | Admitting: Physical Therapy

## 2021-04-22 DIAGNOSIS — M25551 Pain in right hip: Secondary | ICD-10-CM | POA: Diagnosis not present

## 2021-04-22 NOTE — Therapy (Signed)
[facility] [address], [place], [address] Phone: [phone]   Fax:  [phone]  Physical Therapy Treatment/DC Summary Progress Note 03/13/21 - 04/22/21  Patient Details  Name: Christine Ball MRN: [id number] Date of Birth: 07/27/1980 No data recorded  Encounter Date: 04/22/2021   PT End of Session - 04/22/21 1022     Visit Number 11    Number of Visits 17    Date for PT Re-Evaluation 05/09/21    PT Start Time 0947    PT Stop Time 1025    PT Time Calculation (min) 38 min    Activity Tolerance Patient tolerated treatment well    Behavior During Therapy [facility] for tasks assessed/performed             Past Medical History:  Diagnosis Date   Anxiety    Chronic insomnia    Eczema    Right hip pain     Past Surgical History:  Procedure Laterality Date   WISDOM TOOTH EXTRACTION      There were no vitals filed for this visit.   Subjective Assessment - 04/22/21 0955     Subjective Patient reports feeling very good during and after her run which she is happy with. She had some hip flexor soreness, and other tweaks after running on different terrain, but overall doing very well to d/c PT.    Pertinent History Patient is a 40 year old female presenting with R hip pain over 10 years with recent exacerbation 02/26/21. Was running on flat concrete at 3rd mile of usual 64mle run (completed 3 days a week) and felt a pop at the calf (MRI demonstrates medial gastroc strain 03/05/21), and later felt R hip pain worse than usual. Was able to walk back home following this. Pain is at lateral hip is constant 2-3/10 and 7/10 at its worst. Pain is localized to greater trochanter with some radiation into superior buttock. Pain is worse with running over 425mes or more days a week than 3, sleeping and laying on R side, stairs, uphill walking, lateral movements, and running on alternative terrain. Patient walks daily  1-28m48ms walking large dog, and runs 3x/week 4mi69m. Patient enjoys paddleboarding, reading, and is a mom to 2 teenagers. Enjoys running 5Ks with her family. Secondary L gastroc pain is improving with heat, rest, ibprofen with only 1/10 with aggravating movements (stairs, hill walking, feeling off balance). Has had several bouts of PT in the past unsuccessful with TENs, active pump, lateral strenthening, but she has not completed HEP since last bout of PT. Pt denies N/V, B&B changes, unexplained weight fluctuation, saddle paresthesia, fever, night sweats, or unrelenting night pain at this time.    Limitations Sitting    How long can you sit comfortably? 30-60mi528m How long can you walk comfortably? unilimited    Diagnostic tests MRI of gastroc grade 1 strain; MRI of r hip    Patient Stated Goals run without pain    Pain Onset More than a month ago                Ther-Ex Nustep L 5 5min 24mmup with cuing for RPM between 60-70  PT reviewed the following HEP with patient with patient able to demonstrate a set of the following with min cuing for correction needed. PT educated patient on parameters of therex (how/when to inc/decrease intensity, frequency, rep/set range, stretch hold time, and purpose of therex) with verbalized understanding.  Education on dynamic warmup prior to running, and cool down followed by these stretches: Post Exercise Stretches Supine Figure 4 Piriformis Stretch - 1 x daily - 3 x weekly - 30-60 hold Standing ITB Stretch - 1 x daily - 3 x weekly - 30-60sec hold Seated Piriformis Stretch with Trunk Bend - 1 x daily - 3 x weekly - 30-60sec hold Standing Hamstring Stretch on Counter - 1 x daily - 3 x weekly - 30-60 hold Standing Quadriceps Stretch - 1 x daily - 3 x weekly - 30-60sec hold Gastroc Stretch on Wall - 1 x daily - 3 x weekly - 3 sets - 10 reps  Strengthening exercises Access Code: [email or web address] Single Leg Lunge with Foot on Bench - 1 x daily - 2 x weekly - 3  sets - 6-10 reps Single Leg Deadlift with Kettlebell - 1 x daily - 2 x weekly - 3 sets - 6-10 reps Lateral Step Down - 1 x daily - 2 x weekly - 3 sets - 6-10 reps Lateral Lunge - 1 x daily - 2 x weekly - 2-3 sets - 610 reps Kettlebell Deadlift - 1 x daily - 2 x weekly - 2-3 sets - 6-10 reps                           PT Education - 04/22/21 1022     Education Details therex form/technique    Person(s) Educated Patient    Methods Explanation;Demonstration;Verbal cues    Comprehension Verbalized understanding;Returned demonstration;Verbal cues required              PT Short Term Goals - 04/16/21 0915       PT SHORT TERM GOAL #1   Title Pt will be independent with HEP in order to improve strength and decrease back pain in order to improve pain-free function at home and work.    Baseline 03/13/21 HEP given; 04/16/21 completing HEP without question/concern    Time 4    Period Weeks    Status Achieved               PT Long Term Goals - 04/16/21 0914       PT LONG TERM GOAL #1   Title Patient will increase FOTO score to 80 to demonstrate predicted increase in functional mobility to complete ADLs    Baseline 03/13/21 70; 04/16/21 73    Time 8    Period Weeks    Status On-going      PT LONG TERM GOAL #2   Title Pt will decrease worst hip pain as reported on NPRS by at least 2 points in order to demonstrate clinically significant reduction in back pain.    Baseline 03/13/21 7/10; 04/16/21 3.5/10    Time 8    Period Weeks    Status Achieved                   Plan - 04/22/21 1030     Clinical Impression Statement Pt returns to clinic with excellent progress seen post 5k race. Patient reports no pain, and good body awareness during run. Pt has met goals to d/c PT to robust HEP. Patinet is educated on dynamic warmup prior to running, with walking cool down and stretching regimen, as well as a strengthening regimen to complete 2x/week. She is  able to verbalize and demonstrate understanding of parameters of HEP. Pt to d/c PT    Personal Factors and Comorbidities Past/Current  Experience;Time since onset of injury/illness/exacerbation;Fitness    Examination-Activity Limitations Lift;Stairs;Sit    Examination-Participation Restrictions Community Activity;Driving    Stability/Clinical Decision Making Evolving/Moderate complexity    Clinical Decision Making Moderate    Rehab Potential Good    PT Frequency 2x / week    PT Duration 8 weeks    PT Treatment/Interventions ADLs/Self Care Home Management;Stair training;Therapeutic exercise;Passive range of motion;Electrical Stimulation;Moist Heat;Gait training;Therapeutic activities;Neuromuscular re-education;Balance training;Functional mobility training;Traction;Ultrasound;Dry needling;Joint Manipulations;Spinal Manipulations;Taping;Manual techniques    PT Next Visit Plan SL heel raise test, SL squat test    PT Home Exercise Plan isometric hip hike, eccentric heel lower, heat application, activity modifications    Consulted and Agree with Plan of Care Patient             Patient will benefit from skilled therapeutic intervention in order to improve the following deficits and impairments:  Postural dysfunction, Abnormal gait, Decreased activity tolerance, Decreased endurance, Decreased strength, Increased fascial restricitons, Pain, Improper body mechanics, Decreased mobility, Decreased balance, Impaired flexibility, Impaired tone  Visit Diagnosis: Pain in right hip     Problem List Patient Active Problem List   Diagnosis Date Noted   Greater trochanteric pain syndrome of right lower extremity 03/11/2021   Strain of gastrocnemius muscle of left lower extremity 03/11/2021   Insomnia, persistent 03/14/2015   GAD (generalized anxiety disorder) 03/14/2015   Dermatitis, eczematoid 03/14/2015   Strict vegetarian diet 03/14/2015   Nonallopathic lesion of lumbosacral region 10/08/2014    Gluteal tendinitis of right buttock 08/21/2014   Durwin Reges DPT Durwin Reges, PT 04/22/2021, 1:04 PM  [facility] [address], [place], [address] Phone: [phone]   Fax:  [phone]  Name: Christine Ball MRN: [id number] Date of Birth: 07/06/1980

## 2021-04-23 ENCOUNTER — Encounter: Payer: 59 | Admitting: Physical Therapy

## 2021-04-24 ENCOUNTER — Ambulatory Visit: Payer: 59 | Admitting: Physical Therapy

## 2021-04-28 ENCOUNTER — Encounter: Payer: 59 | Admitting: Physical Therapy

## 2021-04-30 ENCOUNTER — Encounter: Payer: 59 | Admitting: Physical Therapy

## 2021-05-05 ENCOUNTER — Encounter: Payer: 59 | Admitting: Physical Therapy

## 2021-05-07 ENCOUNTER — Encounter: Payer: 59 | Admitting: Physical Therapy

## 2021-05-08 ENCOUNTER — Encounter: Payer: 59 | Admitting: Physical Therapy

## 2021-05-13 ENCOUNTER — Encounter: Payer: 59 | Admitting: Physical Therapy

## 2021-05-15 ENCOUNTER — Encounter: Payer: 59 | Admitting: Physical Therapy

## 2021-05-20 ENCOUNTER — Ambulatory Visit: Payer: 59 | Admitting: Family Medicine

## 2021-05-20 ENCOUNTER — Other Ambulatory Visit: Payer: Self-pay

## 2021-05-20 ENCOUNTER — Encounter: Payer: Self-pay | Admitting: Family Medicine

## 2021-05-20 VITALS — BP 114/80 | HR 74 | Ht 65.0 in | Wt 145.0 lb

## 2021-05-20 DIAGNOSIS — M25551 Pain in right hip: Secondary | ICD-10-CM | POA: Diagnosis not present

## 2021-05-20 DIAGNOSIS — M7601 Gluteal tendinitis, right hip: Secondary | ICD-10-CM

## 2021-05-20 MED ORDER — DICLOFENAC SODIUM 75 MG PO TBEC
75.0000 mg | DELAYED_RELEASE_TABLET | Freq: Two times a day (BID) | ORAL | 0 refills | Status: DC | PRN
  Filled 2021-05-20: qty 60, 30d supply, fill #0

## 2021-05-20 NOTE — Assessment & Plan Note (Signed)
Chronic issue that is stable, involving right gluteus medius and minimus. See additional assessment(s) for plan details.

## 2021-05-20 NOTE — Assessment & Plan Note (Addendum)
Patient presents for follow-up to chronic right lateral hip pain in the setting of greater trochanteric pain syndrome and chronic right gluteal tendinopathy. These symptoms have stabilized and she reports improvement following recent corticosteroid injection performed on 04/04/2021, additionally secondary to dry needling through physical therapy, completion of 6 weeks of physical therapy, and compliance with home exercise regimen.  Examination today reveals minimally tender greater trochanter, mildly tender diffusely about this region tracking along the gluteus medius/minimus region, nontender SI joint, negative Ober's test, overall interval improved examination.  Given the chronicity of symptoms (greater than 10 years), the fact that she is an ongoing avid runner, I have reviewed additional evaluation and management steps such as advanced imaging, timeline for anticipated symptom progression given chronicity, and proactive treatments.  At this stage she has deferred MRI which is reasonable given her clinical findings and overall stability/progress.  She will utilize diclofenac on an as-needed basis, medication management performed and new Rx provided, continue with home exercises for both hips to ensure symmetry, and continue/restart physical therapy with a focus on modalities such as dry needling.  She can continue activity as tolerated without restrictions.  We will coordinate a follow-up in 2 months for reevaluation, if continued improvement noted, anticipate longer duration follow-up versus follow-up as needed, if suboptimal progress noted, can revisit corticosteroid injections, additionally trigger point injections, and advanced imaging.

## 2021-05-20 NOTE — Progress Notes (Signed)
Primary Care / Sports Medicine Office Visit  Patient Information:  Patient ID: Christine Ball, female DOB: Jan 02, 1981 Age: 41 y.o. MRN: [id number]   Christine Ball is a pleasant 41 y.o. female presenting with the following:  Chief Complaint  Patient presents with   Greater trochanteric pain syndrome of right lower extremity    Followed with PT x6 weeks; injection 04/04/21, but started to wear off last week per patient; Recent pain started to hurt after inactivity, patient jogs 2-3 times a week along with doing home exercises; taking diclofenac bid prn; 2/10 pain    Patient Active Problem List   Diagnosis Date Noted   Greater trochanteric pain syndrome of right lower extremity 03/11/2021   Strain of gastrocnemius muscle of left lower extremity 03/11/2021   Insomnia, persistent 03/14/2015   GAD (generalized anxiety disorder) 03/14/2015   Dermatitis, eczematoid 03/14/2015   Nonallopathic lesion of lumbosacral region 10/08/2014   Gluteal tendinitis of right buttock 08/21/2014    Vitals:   05/20/21 0840  BP: 114/80  Pulse: 74  SpO2: 99%   Vitals:   05/20/21 0840  Weight: 145 lb (65.8 kg)  Height: 5\' 5"  (1.651 m)   Body mass index is 24.13 kg/m.  No results found.   Independent interpretation of notes and tests performed by another provider:   None  Procedures performed:   None  Pertinent History, Exam, Impression, and Recommendations:   Greater trochanteric pain syndrome of right lower extremity Patient presents for follow-up to chronic right lateral hip pain in the setting of greater trochanteric pain syndrome and chronic right gluteal tendinopathy. These symptoms have stabilized and she reports improvement following recent corticosteroid injection performed on 04/04/2021, additionally secondary to dry needling through physical therapy, completion of 6 weeks of physical therapy, and compliance with home exercise regimen.  Examination today reveals  minimally tender greater trochanter, mildly tender diffusely about this region tracking along the gluteus medius/minimus region, nontender SI joint, negative Ober's test, overall interval improved examination.  Given the chronicity of symptoms (greater than 10 years), the fact that she is an ongoing avid runner, I have reviewed additional evaluation and management steps such as advanced imaging, timeline for anticipated symptom progression given chronicity, and proactive treatments.  At this stage she has deferred MRI which is reasonable given her clinical findings and overall stability/progress.  She will utilize diclofenac on an as-needed basis, medication management performed and new Rx provided, continue with home exercises for both hips to ensure symmetry, and continue/restart physical therapy with a focus on modalities such as dry needling.  She can continue activity as tolerated without restrictions.  We will coordinate a follow-up in 2 months for reevaluation, if continued improvement noted, anticipate longer duration follow-up versus follow-up as needed, if suboptimal progress noted, can revisit corticosteroid injections, additionally trigger point injections, and advanced imaging.  Gluteal tendinitis of right buttock Chronic issue that is stable, involving right gluteus medius and minimus. See additional assessment(s) for plan details.   Orders & Medications Meds ordered this encounter  Medications   diclofenac (VOLTAREN) 75 MG EC tablet    Sig: Take 1 tablet (75 mg total) by mouth 2 (two) times daily as needed.    Dispense:  60 tablet    Refill:  0   Orders Placed This Encounter  Procedures   Ambulatory referral to Physical Therapy     Return in about 8 weeks (around 07/15/2021).     Montel Culver, MD   Primary  [facility]

## 2021-05-20 NOTE — Patient Instructions (Addendum)
-   Restart physical therapy, primarily for dry needling/modalities - Continue activity as tolerated using hip symptoms as a guide (contact for any significant symptom worsening) - Can dose diclofenac on an as-needed basis - Return for follow-up in 8 weeks, contact for questions between now and

## 2021-06-18 ENCOUNTER — Encounter: Payer: Self-pay | Admitting: Certified Nurse Midwife

## 2021-06-18 ENCOUNTER — Ambulatory Visit (INDEPENDENT_AMBULATORY_CARE_PROVIDER_SITE_OTHER): Payer: 59 | Admitting: Certified Nurse Midwife

## 2021-06-18 ENCOUNTER — Other Ambulatory Visit: Payer: Self-pay

## 2021-06-18 VITALS — BP 132/89 | HR 90 | Ht 65.0 in | Wt 143.7 lb

## 2021-06-18 DIAGNOSIS — Z01419 Encounter for gynecological examination (general) (routine) without abnormal findings: Secondary | ICD-10-CM

## 2021-06-18 DIAGNOSIS — N926 Irregular menstruation, unspecified: Secondary | ICD-10-CM

## 2021-06-18 DIAGNOSIS — Z1231 Encounter for screening mammogram for malignant neoplasm of breast: Secondary | ICD-10-CM

## 2021-06-18 LAB — POCT URINE PREGNANCY: Preg Test, Ur: NEGATIVE

## 2021-06-18 NOTE — Patient Instructions (Signed)

## 2021-06-18 NOTE — Progress Notes (Signed)
GYNECOLOGY ANNUAL PREVENTATIVE CARE ENCOUNTER NOTE  History:     Christine Ball is a 41 y.o. [id number] female here for a routine annual gynecologic exam.  Current complaints: pt has heavy period and is going on a few trips this year. She is concerned about bleeding and is interested in options for cycle control.   Denies abnormal vaginal bleeding, discharge, pelvic pain, problems with intercourse or other gynecologic concerns.     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   Gynecologic History Patient's last menstrual period was 05/14/2021 (exact date). Contraception: vasectomy Last Pap: 01/16/2019. Results were: normal with negative HPV Last mammogram: 03/02/2021. Results were: normal  Obstetric History OB History  Gravida Para Term Preterm AB Living  2 2 2     2   SAB IAB Ectopic Multiple Live Births          2    # Outcome Date GA Lbr Len/2nd Weight Sex Delivery Anes PTL Lv  2 Term 07/21/07   8 lb 10 oz (3.912 kg) M Vag-Spont  N LIV  1 Term 08/13/05   9 lb 3 oz (4.167 kg) F Vag-Spont  N LIV    Past Medical History:  Diagnosis Date   Anxiety    Chronic insomnia    Eczema    Right hip pain     Past Surgical History:  Procedure Laterality Date   WISDOM TOOTH EXTRACTION      Current Outpatient Medications on File Prior to Visit  Medication Sig Dispense Refill   cholecalciferol (VITAMIN D3) 25 MCG (1000 UNIT) tablet Take 1,000 Units by mouth daily.     [facility] MELATONIN GUMMIES PO Take 1 each by mouth every evening. It is a combo of melatonin 3mg   and CBD 10 mg     diclofenac (VOLTAREN) 75 MG EC tablet Take 1 tablet (75 mg total) by mouth 2 (two) times daily as needed. 60 tablet 0   doxylamine, Sleep, (UNISOM) 25 MG tablet Take 12.5 mg by mouth every evening.     Ferrous Sulfate (IRON PO) Take 1 tablet by mouth as needed.      Multiple Vitamin (MULTIVITAMIN) tablet Take 1 tablet by mouth daily.     Omega-3 Fatty Acids (FISH OIL) 1000 MG CAPS Take 2 each by mouth daily.     psyllium (METAMUCIL) 58.6 % packet Take 1 packet by mouth daily.     No current facility-administered medications on file prior to visit.    Allergies  Allergen Reactions   Penicillins Hives   Sulfa Antibiotics Hives    Social History:  reports that she has never smoked. She has never used smokeless tobacco. She reports current alcohol use of about 1.0 standard drink per week. She reports that she does not use drugs.  Family History  Problem Relation Age of Onset   Osteoporosis Mother    Breast cancer Mother 9   Other Father    Metabolic syndrome Father    Other Paternal Grandmother        Polio   Cancer Paternal Grandfather     The following portions of the patient's history were reviewed and updated as appropriate: allergies, current medications, past family history, past medical history, past social history, past surgical history and problem list.  Review of Systems Pertinent items noted in HPI and remainder of comprehensive ROS  otherwise negative.  Physical Exam:  BP 132/89    Pulse 90    Ht 5\' 5"  (1.651 m)    Wt 143 lb 11.2 oz (65.2 kg)    LMP 05/14/2021 (Exact Date)    BMI 23.91 kg/m  CONSTITUTIONAL: Well-developed, well-nourished female in no acute distress.  HENT:  Normocephalic, atraumatic, External right and left ear normal. Oropharynx is clear and moist EYES: Conjunctivae and EOM are normal. Pupils are equal, round, and reactive to light. No scleral icterus.  NECK: Normal range of motion, supple, no masses.  Normal thyroid.  SKIN: Skin is warm and dry. No rash noted. Not diaphoretic. No erythema. No pallor. MUSCULOSKELETAL: Normal range of motion. No tenderness.  No cyanosis, clubbing, or edema.  2+ distal pulses. NEUROLOGIC: Alert and oriented to person, place, and time. Normal reflexes, muscle tone coordination.   PSYCHIATRIC: Normal mood and affect. Normal behavior. Normal judgment and thought content. CARDIOVASCULAR: Normal heart rate noted, regular rhythm RESPIRATORY: Clear to auscultation bilaterally. Effort and breath sounds normal, no problems with respiration noted. BREASTS: Symmetric in size. No masses, tenderness, skin changes, nipple drainage, or lymphadenopathy bilaterally.  ABDOMEN: Soft, no distention noted.  No tenderness, rebound or guarding.  PELVIC: Normal appearing external genitalia and urethral meatus; normal appearing vaginal mucosa and cervix.  No abnormal discharge noted.  Pap smear not due.  Normal uterine size, no other palpable masses, no uterine or adnexal tenderness.  .   Assessment and Plan:    1. Well woman exam with routine gynecological exam  Pap: not due  Mammogram :  ordered Labs: none Refills: none Referral: none Discussed bc options,  she is interested in IUD. Discussed possible use ocp with IUD x 3 months if bleeding is heavy or irregular.  Routine preventative health maintenance measures emphasized. Please refer to After Visit Summary for other counseling recommendations.      Philip Aspen, CNM [facility]

## 2021-07-02 ENCOUNTER — Encounter: Payer: Self-pay | Admitting: Certified Nurse Midwife

## 2021-07-02 ENCOUNTER — Other Ambulatory Visit: Payer: Self-pay

## 2021-07-02 ENCOUNTER — Ambulatory Visit (INDEPENDENT_AMBULATORY_CARE_PROVIDER_SITE_OTHER): Payer: 59 | Admitting: Certified Nurse Midwife

## 2021-07-02 VITALS — BP 116/77 | HR 79 | Ht 65.0 in | Wt 143.8 lb

## 2021-07-02 DIAGNOSIS — Z30018 Encounter for initial prescription of other contraceptives: Secondary | ICD-10-CM

## 2021-07-02 LAB — POCT URINE PREGNANCY: Preg Test, Ur: NEGATIVE

## 2021-07-02 NOTE — Progress Notes (Signed)
° °  GYNECOLOGY OFFICE PROCEDURE NOTE  Christine Ball is a 41 y.o. [id number] here for mirena IUD insertion. No GYN concerns.  Is is needing for bleeding control. Her partner has vasectomy. Last pap smear was on 01/16/2019 and was normal.  IUD Insertion Procedure Note Patient identified, informed consent performed, consent signed.   Discussed risks of irregular bleeding, cramping, infection, malpositioning or misplacement of the IUD outside the uterus which may require further procedure such as laparoscopy. Also discussed >99% contraception efficacy, increased risk of ectopic pregnancy with failure of method.   Emphasized that this did not protect against STIs, condoms recommended during all sexual encounters. Time out was performed.  Urine pregnancy test negative.  Speculum placed in the vagina.  Cervix visualized.  Cleaned with Betadine x 2.  Grasped anteriorly with a single tooth tenaculum.  Uterus sounded to 8.5 cm.  Mirena IUD placed per manufacturer's recommendations.  Strings trimmed to 3 cm. Tenaculum was removed, good hemostasis noted.  Patient tolerated procedure well.   Patient was given post-procedure instructions.    Patient was also asked to check IUD strings periodically and follow up in 4 weeks for IUD check.   Philip Aspen, CNM

## 2021-07-02 NOTE — Patient Instructions (Signed)

## 2021-07-18 ENCOUNTER — Ambulatory Visit: Payer: 59 | Admitting: Family Medicine

## 2021-07-30 ENCOUNTER — Encounter: Payer: Self-pay | Admitting: Certified Nurse Midwife

## 2021-07-30 ENCOUNTER — Other Ambulatory Visit: Payer: Self-pay

## 2021-07-30 ENCOUNTER — Ambulatory Visit (INDEPENDENT_AMBULATORY_CARE_PROVIDER_SITE_OTHER): Payer: 59 | Admitting: Certified Nurse Midwife

## 2021-07-30 VITALS — BP 113/77 | HR 66 | Ht 65.0 in | Wt 141.8 lb

## 2021-07-30 DIAGNOSIS — Z30431 Encounter for routine checking of intrauterine contraceptive device: Secondary | ICD-10-CM

## 2021-07-30 NOTE — Progress Notes (Signed)
? ?  GYNECOLOGY OFFICE ENCOUNTER NOTE ? ?History:  ?41 y.o. [id number] here today for today for IUD string check; Mirena  IUD was placed  07/02/2021. No complaints about the IUD, no concerning side effects. ? ?The following portions of the patient's history were reviewed and updated as appropriate: allergies, current medications, past family history, past medical history, past social history, past surgical history and problem list. Last pap smear on 12/19/2018 was normal, negative . ?Review of Systems:  ?Pertinent items are noted in HPI. ?  ?Objective:  ?Physical Exam ?Blood pressure 113/77, pulse 66, height '5\' 5"'$  (1.651 m), weight 141 lb 12.8 oz (64.3 kg). ?CONSTITUTIONAL: Well-developed, well-nourished female in no acute distress.  ?NEUROLOGIC: Alert and oriented to person, place, and time. Normal reflexes, muscle tone coordination.  ?PSYCHIATRIC: Normal mood and affect. Normal behavior. Normal judgment and thought content. ?CARDIOVASCULAR: Normal heart rate noted ?RESPIRATORY: Effort and breath sounds normal, no problems with respiration noted ?ABDOMEN: Soft, no distention noted.   ?PELVIC: Normal appearing external genitalia; normal appearing vaginal mucosa and cervix.  IUD strings visualized, about 3 cm in length outside cervix. Marland Kitchen  ? ?Assessment & Plan:  ?Patient to keep IUD in place for up to eight years; can come in for removal earlier if she desires or for any concerning side effects. Recommended condoms for STI prevention. ? ? ?Philip Aspen, CNM  ?

## 2021-07-30 NOTE — Patient Instructions (Signed)

## 2021-09-18 ENCOUNTER — Encounter: Payer: 59 | Admitting: Family Medicine

## 2021-10-21 ENCOUNTER — Other Ambulatory Visit: Payer: Self-pay

## 2021-10-21 MED ORDER — OFLOXACIN 0.3 % OP SOLN
OPHTHALMIC | 0 refills | Status: DC
  Filled 2021-10-21: qty 5, 9d supply, fill #0

## 2021-10-21 MED ORDER — PREDNISOLONE ACETATE 1 % OP SUSP
OPHTHALMIC | 0 refills | Status: DC
  Filled 2021-10-21: qty 5, 9d supply, fill #0

## 2021-10-21 MED ORDER — DIAZEPAM 5 MG PO TABS
ORAL_TABLET | ORAL | 0 refills | Status: DC
  Filled 2021-10-21: qty 1, 1d supply, fill #0

## 2021-10-23 HISTORY — PX: LASIK: SHX215

## 2022-01-01 ENCOUNTER — Telehealth: Payer: Self-pay | Admitting: Family Medicine

## 2022-01-01 NOTE — Telephone Encounter (Signed)
Copied from [place] [id number]. Topic: Appointment Scheduling - Scheduling Inquiry for Clinic >> Jan 01, 2022  1:41 PM Rosanne Ashing P wrote: Reason for CRM: t has an appt for a cpe on the 23rd but she said Dr. Ancil Boozer told her to have labs before her cpe.

## 2022-01-02 ENCOUNTER — Other Ambulatory Visit: Payer: Self-pay | Admitting: Family Medicine

## 2022-01-02 DIAGNOSIS — Z130 Encounter for screening for diseases of the blood and blood-forming organs and certain disorders involving the immune mechanism: Secondary | ICD-10-CM

## 2022-01-02 DIAGNOSIS — Z79899 Other long term (current) drug therapy: Secondary | ICD-10-CM

## 2022-01-02 DIAGNOSIS — Z131 Encounter for screening for diabetes mellitus: Secondary | ICD-10-CM

## 2022-01-02 DIAGNOSIS — Z13220 Encounter for screening for lipoid disorders: Secondary | ICD-10-CM

## 2022-01-02 NOTE — Telephone Encounter (Signed)
Called and let patient know lab orders are ready. She gave verbal understanding.

## 2022-01-05 DIAGNOSIS — Z131 Encounter for screening for diabetes mellitus: Secondary | ICD-10-CM | POA: Diagnosis not present

## 2022-01-05 DIAGNOSIS — Z79899 Other long term (current) drug therapy: Secondary | ICD-10-CM | POA: Diagnosis not present

## 2022-01-05 DIAGNOSIS — Z130 Encounter for screening for diseases of the blood and blood-forming organs and certain disorders involving the immune mechanism: Secondary | ICD-10-CM | POA: Diagnosis not present

## 2022-01-05 DIAGNOSIS — Z13220 Encounter for screening for lipoid disorders: Secondary | ICD-10-CM | POA: Diagnosis not present

## 2022-01-06 NOTE — Progress Notes (Unsigned)
Name: Christine Ball   MRN: [id number]    DOB: 10/26/1980   Date:01/07/2022       Progress Note  Subjective  Chief Complaint  Annual Exam  HPI  Patient presents for annual CPE.  Diet: balanced  Exercise:  she is physically active - doing beach body videos  Last Eye Exam: had laser eye surgery  Last Dental Exam: twice a year   [place] Visit from 05/20/2021 in [facility]  AUDIT-C Score 0      Depression: Phq 9 is  negative    01/07/2022    8:01 AM 05/20/2021    9:21 AM 03/11/2021    9:30 AM 02/28/2021    1:05 PM 09/17/2020    9:45 AM  Depression screen PHQ 2/9  Decreased Interest 0 0 0 0 0  Down, Depressed, Hopeless 0 1 1 0 1  PHQ - 2 Score 0 1 1 0 1  Altered sleeping 0 0 0  0  Tired, decreased energy 0 0 0  0  Change in appetite 0 0 0  0  Feeling bad or failure about yourself  0 1 1  0  Trouble concentrating 0 0 0  0  Moving slowly or fidgety/restless 0 0 0  0  Suicidal thoughts 0 0 0  0  PHQ-9 Score 0 '2 2  1  ' Difficult doing work/chores  Not difficult at all Not difficult at all     Hypertension: BP Readings from Last 3 Encounters:  01/07/22 112/68  07/30/21 113/77  07/02/21 116/77   Obesity: Wt Readings from Last 3 Encounters:  01/07/22 144 lb (65.3 kg)  07/30/21 141 lb 12.8 oz (64.3 kg)  07/02/21 143 lb 12.8 oz (65.2 kg)   BMI Readings from Last 3 Encounters:  01/07/22 23.96 kg/m  07/30/21 23.60 kg/m  07/02/21 23.93 kg/m     Vaccines:   HPV: N/A Tdap: up to date Shingrix: N/A Pneumonia: N/A Flu: Due, she will wait until October  COVID-19: up to date   Hep C Screening: 09/17/20 STD testing and prevention (HIV/chl/gon/syphilis): N/A Intimate partner violence: negative screen  Sexual History : one sexual partner , no pain or discomfort  Menstrual History/LMP/Abnormal Bleeding: IUD Feb 2023 for heavy cycles and contraception, periods are much lighter now  Discussed importance of  follow up if any post-menopausal bleeding: no  Incontinence Symptoms: negative for symptoms   Breast cancer:  - Last Mammogram: 02/20/21 - BRCA gene screening: N/A  Osteoporosis Prevention : Discussed high calcium and vitamin D supplementation, weight bearing exercises Bone density: not applicable   Cervical cancer screening: Sees GYN   Skin cancer: Discussed monitoring for atypical lesions  Colorectal cancer: N/A   Lung cancer:  Low Dose CT Chest recommended if Age 61-80 years, 20 pack-year currently smoking OR have quit w/in 15years. Patient does not qualify for screen   ECG: N/A  Advanced Care Planning: A voluntary discussion about advance care planning including the explanation and discussion of advance directives.  Discussed health care proxy and Living will, and the patient was able to identify a health care proxy as husband .  Patient does  have a living will and power of attorney of health care   Lipids: Lab Results  Component Value Date   CHOL 164 01/05/2022   CHOL 165 09/17/2020   CHOL 169 11/16/2018   Lab Results  Component Value Date   HDL 51 01/05/2022   HDL  55 09/17/2020   HDL 54 11/16/2018   Lab Results  Component Value Date   LDLCALC 91 01/05/2022   LDLCALC 91 09/17/2020   LDLCALC 96 11/16/2018   Lab Results  Component Value Date   TRIG 123 01/05/2022   TRIG 91 09/17/2020   TRIG 95 11/16/2018   Lab Results  Component Value Date   CHOLHDL 3.2 01/05/2022   CHOLHDL 3.0 09/17/2020   CHOLHDL 3.1 11/16/2018   No results found for: "LDLDIRECT"  Glucose: Glucose  Date Value Ref Range Status  11/16/2018 90 65 - 99 mg/dL Final  03/20/2015 87 65 - 99 mg/dL Final   Glucose, Bld  Date Value Ref Range Status  01/05/2022 81 65 - 99 mg/dL Final    Comment:    .            Fasting reference interval .   09/17/2020 83 65 - 99 mg/dL Final    Comment:    .            Fasting reference interval .     Patient Active Problem List   Diagnosis Date  Noted   Greater trochanteric pain syndrome of right lower extremity 03/11/2021   Strain of gastrocnemius muscle of left lower extremity 03/11/2021   Insomnia, persistent 03/14/2015   GAD (generalized anxiety disorder) 03/14/2015   Dermatitis, eczematoid 03/14/2015   Nonallopathic lesion of lumbosacral region 10/08/2014   Gluteal tendinitis of right buttock 08/21/2014    Past Surgical History:  Procedure Laterality Date   WISDOM TOOTH EXTRACTION      Family History  Problem Relation Age of Onset   Osteoporosis Mother    Breast cancer Mother 34   Other Father    Metabolic syndrome Father    Other Paternal Grandmother        Polio   Cancer Paternal Grandfather     Social History   Socioeconomic History   Marital status: Married    Spouse name: Arlen Legendre   Number of children: 2   Years of education: 16   Highest education level: Bachelor's degree (e.g., BA, AB, BS)  Occupational History   Occupation: stay at home mother   Tobacco Use   Smoking status: Never   Smokeless tobacco: Never  Vaping Use   Vaping Use: Never used  Substance and Sexual Activity   Alcohol use: Yes    Alcohol/week: 1.0 standard drink of alcohol    Types: 1 Standard drinks or equivalent per week   Drug use: Never   Sexual activity: Yes    Partners: Male    Birth control/protection: Surgical, None  Other Topics Concern   Not on file  Social History Narrative   Daughter had blebs and pneumothorax twice in the Spring of 2022   Social Determinants of Health   Financial Resource Strain: Low Risk  (01/07/2022)   Overall Financial Resource Strain (CARDIA)    Difficulty of Paying Living Expenses: Not hard at all  Food Insecurity: No Food Insecurity (01/07/2022)   Hunger Vital Sign    Worried About Running Out of Food in the Last Year: Never true    [place] in the Last Year: Never true  Transportation Needs: No Transportation Needs (01/07/2022)   PRAPARE - Radiographer, therapeutic (Medical): No    Lack of Transportation (Non-Medical): No  Physical Activity: Sufficiently Active (01/07/2022)   Exercise Vital Sign    Days of Exercise per Week: 5 days  Minutes of Exercise per Session: 30 min  Stress: No Stress Concern Present (01/07/2022)   [place]    Feeling of Stress : Only a little  Social Connections: Socially Integrated (01/07/2022)   Social Connection and Isolation Panel [NHANES]    Frequency of Communication with Friends and Family: Twice a week    Frequency of Social Gatherings with Friends and Family: Twice a week    Attends Religious Services: More than 4 times per year    Active Member of [employer] or Organizations: Yes    Attends Music therapist: More than 4 times per year    Marital Status: Married  Human resources officer Violence: Not At Risk (01/07/2022)   Humiliation, Afraid, Rape, and Kick questionnaire    Fear of Current or Ex-Partner: No    Emotionally Abused: No    Physically Abused: No    Sexually Abused: No     Current Outpatient Medications:    cholecalciferol (VITAMIN D3) 25 MCG (1000 UNIT) tablet, Take 1,000 Units by mouth daily., Disp: , Rfl:    [employer] MELATONIN GUMMIES PO, Take 1 each by mouth every evening. It is a combo of melatonin 41m  and CBD 10 mg, Disp: , Rfl:    doxylamine, Sleep, (UNISOM) 25 MG tablet, Take 12.5 mg by mouth every evening., Disp: , Rfl:    Ferrous Sulfate (IRON PO), Take 1 tablet by mouth as needed., Disp: , Rfl:    levonorgestrel (MIRENA, 52 MG,) 20 MCG/DAY IUD, 1 each by Intrauterine route once. INSERTED 07/02/21, Disp: , Rfl:    Multiple Vitamin (MULTIVITAMIN) tablet, Take 1 tablet by mouth daily., Disp: , Rfl:    Omega-3 Fatty Acids (FISH OIL) 1000 MG CAPS, Take 2 each by mouth daily., Disp: , Rfl:    psyllium (METAMUCIL) 58.6 % packet, Take 1 packet by mouth daily., Disp: , Rfl:    diazepam (VALIUM) 5 MG tablet, 1 tablet to  bring to appointment (Patient not taking: Reported on 01/07/2022), Disp: 1 tablet, Rfl: 0   diclofenac (VOLTAREN) 75 MG EC tablet, Take 1 tablet (75 mg total) by mouth 2 (two) times daily as needed. (Patient not taking: Reported on 01/07/2022), Disp: 60 tablet, Rfl: 0   ofloxacin (OCUFLOX) 0.3 % ophthalmic solution, Location: Both Eyes. 1 drop 4 times a day both eyes (Patient not taking: Reported on 01/07/2022), Disp: 5 mL, Rfl: 0   prednisoLONE acetate (PRED FORTE) 1 % ophthalmic suspension, Location: Both Eyes. 1 drop 4 times a day both eyes (Patient not taking: Reported on 01/07/2022), Disp: 5 mL, Rfl: 0  Allergies  Allergen Reactions   Penicillins Hives   Sulfa Antibiotics Hives     ROS  Constitutional: Negative for fever or weight change.  Respiratory: Negative for cough and shortness of breath.   Cardiovascular: Negative for chest pain or palpitations.  Gastrointestinal: Negative for abdominal pain, no bowel changes.  Musculoskeletal: Negative for gait problem or joint swelling.  Skin: Negative for rash.  Neurological: Negative for dizziness or headache.  No other specific complaints in a complete review of systems (except as listed in HPI above).   Objective  Vitals:   01/07/22 0801  BP: 112/68  Pulse: 89  Resp: 16  SpO2: 99%  Weight: 144 lb (65.3 kg)  Height: '5\' 5"'  (1.651 m)    Body mass index is 23.96 kg/m.  Physical Exam  Constitutional: Patient appears well-developed and well-nourished. No distress.  HENT: Head: Normocephalic and atraumatic.  Ears: B TMs ok, no erythema or effusion; Nose: Nose normal. Mouth/Throat: Oropharynx is clear and moist. No oropharyngeal exudate.  Eyes: Conjunctivae and EOM are normal. Pupils are equal, round, and reactive to light. No scleral icterus.  Neck: Normal range of motion. Neck supple. No JVD present. No thyromegaly present.  Cardiovascular: Normal rate, regular rhythm and normal heart sounds.  No murmur heard. No BLE  edema. Pulmonary/Chest: Effort normal and breath sounds normal. No respiratory distress. Abdominal: Soft. Bowel sounds are normal, no distension. There is no tenderness. no masses Breast: lumpy breast  no nipple discharge or rashes FEMALE GENITALIA:  External genitalia normal External urethra normal Vaginal vault normal without discharge or lesions Cervix normal without discharge or lesions Bimanual exam normal without masses RECTAL: not done  Musculoskeletal: Normal range of motion, no joint effusions. No gross deformities Neurological: he is alert and oriented to person, place, and time. No cranial nerve deficit. Coordination, balance, strength, speech and gait are normal.  Skin: Skin is warm and dry. No rash noted. No erythema.  Psychiatric: Patient has a normal mood and affect. behavior is normal. Judgment and thought content normal.   Recent Results (from the past 2160 hour(s))  Lipid panel     Status: None   Collection Time: 01/05/22  8:23 AM  Result Value Ref Range   Cholesterol 164 <200 mg/dL   HDL 51 > OR = 50 mg/dL   Triglycerides 123 <150 mg/dL   LDL Cholesterol (Calc) 91 mg/dL (calc)    Comment: Reference range: <100 . Desirable range <100 mg/dL for primary prevention;   <70 mg/dL for patients with CHD or diabetic patients  with > or = 2 CHD risk factors. Marland Kitchen LDL-C is now calculated using the Martin-Hopkins  calculation, which is a validated novel method providing  better accuracy than the Friedewald equation in the  estimation of LDL-C.  Cresenciano Genre et al. Annamaria Helling. 1610;960(45): 2061-2068  (http://education.[facility].com/faq/FAQ164)    Total CHOL/HDL Ratio 3.2 <5.0 (calc)   Non-HDL Cholesterol (Calc) 113 <130 mg/dL (calc)    Comment: For patients with diabetes plus 1 major ASCVD risk  factor, treating to a non-HDL-C goal of <100 mg/dL  (LDL-C of <70 mg/dL) is considered a therapeutic  option.   CBC with Differential/Platelet     Status: Abnormal   Collection  Time: 01/05/22  8:23 AM  Result Value Ref Range   WBC 7.4 3.8 - 10.8 Thousand/uL   RBC 4.90 3.80 - 5.10 Million/uL   Hemoglobin 15.2 11.7 - 15.5 g/dL   HCT 45.9 (H) 35.0 - 45.0 %   MCV 93.7 80.0 - 100.0 fL   MCH 31.0 27.0 - 33.0 pg   MCHC 33.1 32.0 - 36.0 g/dL   RDW 13.7 11.0 - 15.0 %   Platelets 299 140 - 400 Thousand/uL   MPV 10.1 7.5 - 12.5 fL   Neutro Abs 4,862 1,500 - 7,800 cells/uL   Lymphs Abs 1,791 850 - 3,900 cells/uL   Absolute Monocytes 592 200 - 950 cells/uL   Eosinophils Absolute 118 15 - 500 cells/uL   Basophils Absolute 37 0 - 200 cells/uL   Neutrophils Relative % 65.7 %   Total Lymphocyte 24.2 %   Monocytes Relative 8.0 %   Eosinophils Relative 1.6 %   Basophils Relative 0.5 %  COMPLETE METABOLIC PANEL WITH GFR     Status: Abnormal   Collection Time: 01/05/22  8:23 AM  Result Value Ref Range   Glucose, Bld 81 65 - 99 mg/dL  Comment: .            Fasting reference interval .    BUN 14 7 - 25 mg/dL   Creat 0.92 0.50 - 0.99 mg/dL   eGFR 80 > OR = 60 mL/min/1.8m   BUN/Creatinine Ratio SEE NOTE: 6 - 22 (calc)    Comment:    Not Reported: BUN and Creatinine are within    reference range. .    Sodium 140 135 - 146 mmol/L   Potassium 5.6 (H) 3.5 - 5.3 mmol/L   Chloride 105 98 - 110 mmol/L   CO2 26 20 - 32 mmol/L   Calcium 9.7 8.6 - 10.2 mg/dL   Total Protein 6.7 6.1 - 8.1 g/dL   Albumin 4.2 3.6 - 5.1 g/dL   Globulin 2.5 1.9 - 3.7 g/dL (calc)   AG Ratio 1.7 1.0 - 2.5 (calc)   Total Bilirubin 0.6 0.2 - 1.2 mg/dL   Alkaline phosphatase (APISO) 55 31 - 125 U/L   AST 14 10 - 30 U/L   ALT 11 6 - 29 U/L  Hemoglobin A1c     Status: None   Collection Time: 01/05/22  8:23 AM  Result Value Ref Range   Hgb A1c MFr Bld 5.0 <5.7 % of total Hgb    Comment: For the purpose of screening for the presence of diabetes: . <5.7%       Consistent with the absence of diabetes 5.7-6.4%    Consistent with increased risk for diabetes             (prediabetes) > or =6.5%   Consistent with diabetes . This assay result is consistent with a decreased risk of diabetes. . Currently, no consensus exists regarding use of hemoglobin A1c for diagnosis of diabetes in children. . According to American Diabetes Association (ADA) guidelines, hemoglobin A1c <7.0% represents optimal control in non-pregnant diabetic patients. Different metrics may apply to specific patient populations.  Standards of Medical Care in Diabetes(ADA). .    Mean Plasma Glucose 97 mg/dL   eAG (mmol/L) 5.4 mmol/L     Fall Risk:    01/07/2022    8:00 AM 05/20/2021    9:21 AM 04/04/2021    8:38 AM 03/11/2021    9:29 AM 02/28/2021    1:05 PM  Fall Risk   Falls in the past year? 0 0 0 0 0  Number falls in past yr: 0 0 0 0 0  Injury with Fall? 0 0 0 0 0  Risk for fall due to : No Fall Risks Orthopedic patient Orthopedic patient Orthopedic patient   Follow up Falls prevention discussed Falls evaluation completed Falls evaluation completed Falls evaluation completed Falls evaluation completed     Functional Status Survey: Is the patient deaf or have difficulty hearing?: No Does the patient have difficulty seeing, even when wearing glasses/contacts?: No Does the patient have difficulty concentrating, remembering, or making decisions?: No Does the patient have difficulty walking or climbing stairs?: No Does the patient have difficulty dressing or bathing?: No Does the patient have difficulty doing errands alone such as visiting a doctor's office or shopping?: No   Assessment & Plan  1. Well adult exam   2. Elevated hematocrit  - Fe+TIBC+Fer   3. Breast cancer screening by mammogram  - MM 3D SCREEN BREAST BILATERAL; Future   -USPSTF grade A and B recommendations reviewed with patient; age-appropriate recommendations, preventive care, screening tests, etc discussed and encouraged; healthy living encouraged; see AVS for patient education  given to patient -Discussed importance of  150 minutes of physical activity weekly, eat two servings of fish weekly, eat one serving of tree nuts ( cashews, pistachios, pecans, almonds.Marland Kitchen) every other day, eat 6 servings of fruit/vegetables daily and drink plenty of water and avoid sweet beverages.   -Reviewed Health Maintenance: Yes.

## 2022-01-06 NOTE — Patient Instructions (Signed)
Preventive Care 40-42 Years Old, Female Preventive care refers to lifestyle choices and visits with your health care provider that can promote health and wellness. Preventive care visits are also called wellness exams. What can I expect for my preventive care visit? Counseling Your health care provider may ask you questions about your: Medical history, including: Past medical problems. Family medical history. Pregnancy history. Current health, including: Menstrual cycle. Method of birth control. Emotional well-being. Home life and relationship well-being. Sexual activity and sexual health. Lifestyle, including: Alcohol, nicotine or tobacco, and drug use. Access to firearms. Diet, exercise, and sleep habits. Work and work environment. Sunscreen use. Safety issues such as seatbelt and bike helmet use. Physical exam Your health care provider will check your: Height and weight. These may be used to calculate your BMI (body mass index). BMI is a measurement that tells if you are at a healthy weight. Waist circumference. This measures the distance around your waistline. This measurement also tells if you are at a healthy weight and may help predict your risk of certain diseases, such as type 2 diabetes and high blood pressure. Heart rate and blood pressure. Body temperature. Skin for abnormal spots. What immunizations do I need?  Vaccines are usually given at various ages, according to a schedule. Your health care provider will recommend vaccines for you based on your age, medical history, and lifestyle or other factors, such as travel or where you work. What tests do I need? Screening Your health care provider may recommend screening tests for certain conditions. This may include: Lipid and cholesterol levels. Diabetes screening. This is done by checking your blood sugar (glucose) after you have not eaten for a while (fasting). Pelvic exam and Pap test. Hepatitis B test. Hepatitis C  test. HIV (human immunodeficiency virus) test. STI (sexually transmitted infection) testing, if you are at risk. Lung cancer screening. Colorectal cancer screening. Mammogram. Talk with your health care provider about when you should start having regular mammograms. This may depend on whether you have a family history of breast cancer. BRCA-related cancer screening. This may be done if you have a family history of breast, ovarian, tubal, or peritoneal cancers. Bone density scan. This is done to screen for osteoporosis. Talk with your health care provider about your test results, treatment options, and if necessary, the need for more tests. Follow these instructions at home: Eating and drinking  Eat a diet that includes fresh fruits and vegetables, whole grains, lean protein, and low-fat dairy products. Take vitamin and mineral supplements as recommended by your health care provider. Do not drink alcohol if: Your health care provider tells you not to drink. You are pregnant, may be pregnant, or are planning to become pregnant. If you drink alcohol: Limit how much you have to 0-1 drink a day. Know how much alcohol is in your drink. In the [place] one drink equals one 12 oz bottle of beer (355 mL), one 5 oz glass of wine (148 mL), or one 1 oz glass of hard liquor (44 mL). Lifestyle Brush your teeth every morning and night with fluoride toothpaste. Floss one time each day. Exercise for at least 30 minutes 5 or more days each week. Do not use any products that contain nicotine or tobacco. These products include cigarettes, chewing tobacco, and vaping devices, such as e-cigarettes. If you need help quitting, ask your health care provider. Do not use drugs. If you are sexually active, practice safe sex. Use a condom or other form of protection to   prevent STIs. If you do not wish to become pregnant, use a form of birth control. If you plan to become pregnant, see your health care provider for a  prepregnancy visit. Take aspirin only as told by your health care provider. Make sure that you understand how much to take and what form to take. Work with your health care provider to find out whether it is safe and beneficial for you to take aspirin daily. Find healthy ways to manage stress, such as: Meditation, yoga, or listening to music. Journaling. Talking to a trusted person. Spending time with friends and family. Minimize exposure to UV radiation to reduce your risk of skin cancer. Safety Always wear your seat belt while driving or riding in a vehicle. Do not drive: If you have been drinking alcohol. Do not ride with someone who has been drinking. When you are tired or distracted. While texting. If you have been using any mind-altering substances or drugs. Wear a helmet and other protective equipment during sports activities. If you have firearms in your house, make sure you follow all gun safety procedures. Seek help if you have been physically or sexually abused. What's next? Visit your health care provider once a year for an annual wellness visit. Ask your health care provider how often you should have your eyes and teeth checked. Stay up to date on all vaccines. This information is not intended to replace advice given to you by your health care provider. Make sure you discuss any questions you have with your health care provider. Document Revised: 10/30/2020 Document Reviewed: 10/30/2020 Elsevier Patient Education  [place].

## 2022-01-07 ENCOUNTER — Encounter: Payer: Self-pay | Admitting: Family Medicine

## 2022-01-07 ENCOUNTER — Ambulatory Visit (INDEPENDENT_AMBULATORY_CARE_PROVIDER_SITE_OTHER): Payer: 59 | Admitting: Family Medicine

## 2022-01-07 VITALS — BP 112/68 | HR 89 | Resp 16 | Ht 65.0 in | Wt 144.0 lb

## 2022-01-07 DIAGNOSIS — Z1231 Encounter for screening mammogram for malignant neoplasm of breast: Secondary | ICD-10-CM

## 2022-01-07 DIAGNOSIS — Z0000 Encounter for general adult medical examination without abnormal findings: Secondary | ICD-10-CM

## 2022-01-07 DIAGNOSIS — R718 Other abnormality of red blood cells: Secondary | ICD-10-CM

## 2022-01-08 ENCOUNTER — Other Ambulatory Visit: Payer: Self-pay | Admitting: Family Medicine

## 2022-01-08 DIAGNOSIS — R790 Abnormal level of blood mineral: Secondary | ICD-10-CM

## 2022-01-08 DIAGNOSIS — Z86018 Personal history of other benign neoplasm: Secondary | ICD-10-CM | POA: Diagnosis not present

## 2022-01-08 DIAGNOSIS — Z09 Encounter for follow-up examination after completed treatment for conditions other than malignant neoplasm: Secondary | ICD-10-CM | POA: Diagnosis not present

## 2022-01-08 DIAGNOSIS — L578 Other skin changes due to chronic exposure to nonionizing radiation: Secondary | ICD-10-CM | POA: Diagnosis not present

## 2022-01-08 DIAGNOSIS — D225 Melanocytic nevi of trunk: Secondary | ICD-10-CM | POA: Diagnosis not present

## 2022-01-08 LAB — COMPLETE METABOLIC PANEL WITH GFR
AG Ratio: 1.7 (calc) (ref 1.0–2.5)
ALT: 11 U/L (ref 6–29)
AST: 14 U/L (ref 10–30)
Albumin: 4.2 g/dL (ref 3.6–5.1)
Alkaline phosphatase (APISO): 55 U/L (ref 31–125)
BUN: 14 mg/dL (ref 7–25)
CO2: 26 mmol/L (ref 20–32)
Calcium: 9.7 mg/dL (ref 8.6–10.2)
Chloride: 105 mmol/L (ref 98–110)
Creat: 0.92 mg/dL (ref 0.50–0.99)
Globulin: 2.5 g/dL (calc) (ref 1.9–3.7)
Glucose, Bld: 81 mg/dL (ref 65–99)
Potassium: 5.6 mmol/L — ABNORMAL HIGH (ref 3.5–5.3)
Sodium: 140 mmol/L (ref 135–146)
Total Bilirubin: 0.6 mg/dL (ref 0.2–1.2)
Total Protein: 6.7 g/dL (ref 6.1–8.1)
eGFR: 80 mL/min/{1.73_m2} (ref >=60)

## 2022-01-08 LAB — CBC WITH DIFFERENTIAL/PLATELET
Absolute Monocytes: 592 cells/uL (ref 200–950)
Basophils Absolute: 37 cells/uL (ref 0–200)
Basophils Relative: 0.5 %
Eosinophils Absolute: 118 cells/uL (ref 15–500)
Eosinophils Relative: 1.6 %
HCT: 45.9 % — ABNORMAL HIGH (ref 35.0–45.0)
Hemoglobin: 15.2 g/dL (ref 11.7–15.5)
Lymphs Abs: 1791 cells/uL (ref 850–3900)
MCH: 31 pg (ref 27.0–33.0)
MCHC: 33.1 g/dL (ref 32.0–36.0)
MCV: 93.7 fL (ref 80.0–100.0)
MPV: 10.1 fL (ref 7.5–12.5)
Monocytes Relative: 8 %
Neutro Abs: 4862 cells/uL (ref 1500–7800)
Neutrophils Relative %: 65.7 %
Platelets: 299 10*3/uL (ref 140–400)
RBC: 4.9 10*6/uL (ref 3.80–5.10)
RDW: 13.7 % (ref 11.0–15.0)
Total Lymphocyte: 24.2 %
WBC: 7.4 10*3/uL (ref 3.8–10.8)

## 2022-01-08 LAB — IRON,TIBC AND FERRITIN PANEL
%SAT: 42 % (calc) (ref 16–45)
Ferritin: 13 ng/mL — ABNORMAL LOW (ref 16–232)
Iron: 155 ug/dL (ref 40–190)
TIBC: 366 mcg/dL (calc) (ref 250–450)

## 2022-01-08 LAB — LIPID PANEL
Cholesterol: 164 mg/dL (ref <200)
HDL: 51 mg/dL (ref >=50)
LDL Cholesterol (Calc): 91 mg/dL (calc)
Non-HDL Cholesterol (Calc): 113 mg/dL (calc) (ref <130)
Total CHOL/HDL Ratio: 3.2 (calc) (ref <5.0)
Triglycerides: 123 mg/dL (ref <150)

## 2022-01-08 LAB — HEMOGLOBIN A1C
Hgb A1c MFr Bld: 5 % of total Hgb (ref <5.7)
Mean Plasma Glucose: 97 mg/dL
eAG (mmol/L): 5.4 mmol/L

## 2022-01-08 LAB — TEST AUTHORIZATION

## 2022-01-28 IMAGING — MR MR [PERSON_NAME] LOW W/O CM*L*
4 of 5 series · 12 of 40 positions shown · non-contrast
Comparison: None.

CLINICAL DATA: Left calf pain.  Felt a pop while running.

EXAM:
MRI OF LOWER LEFT EXTREMITY WITHOUT CONTRAST
TECHNIQUE: Multiplanar, multisequence MR imaging of the left lower leg was
performed. No intravenous contrast was administered.

[Series 4: t1_tse_cor_480_bilat_ · coronal · left · 4.0mm · 0.42mm/px · 3 of 34 slices shown]
[im 7/34]
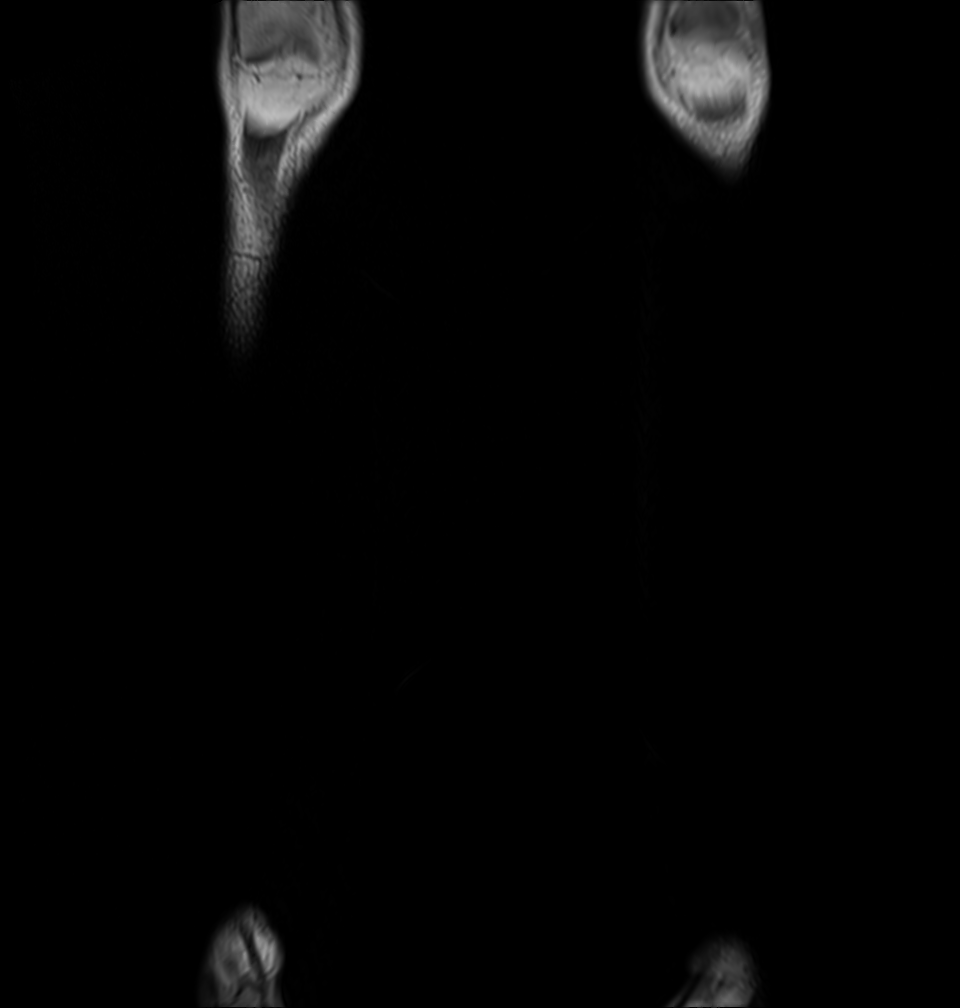
[im 20/34]
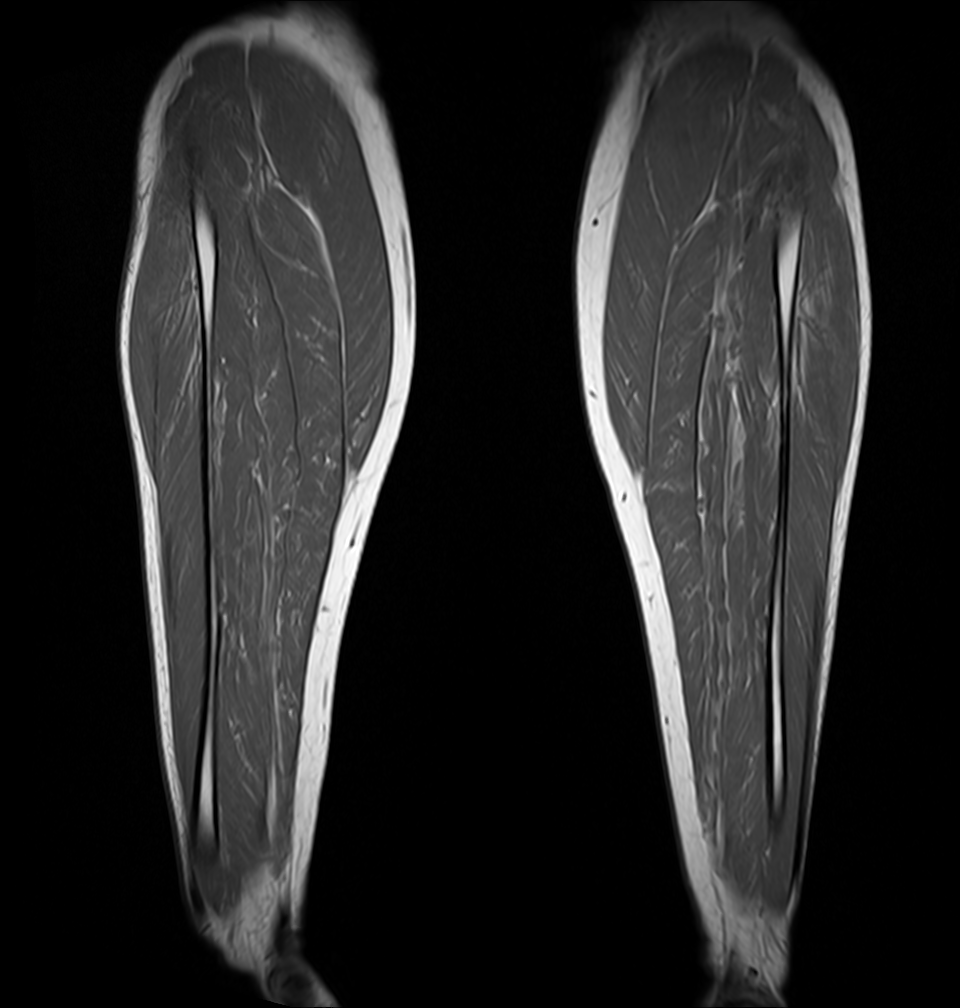
[im 34/34]
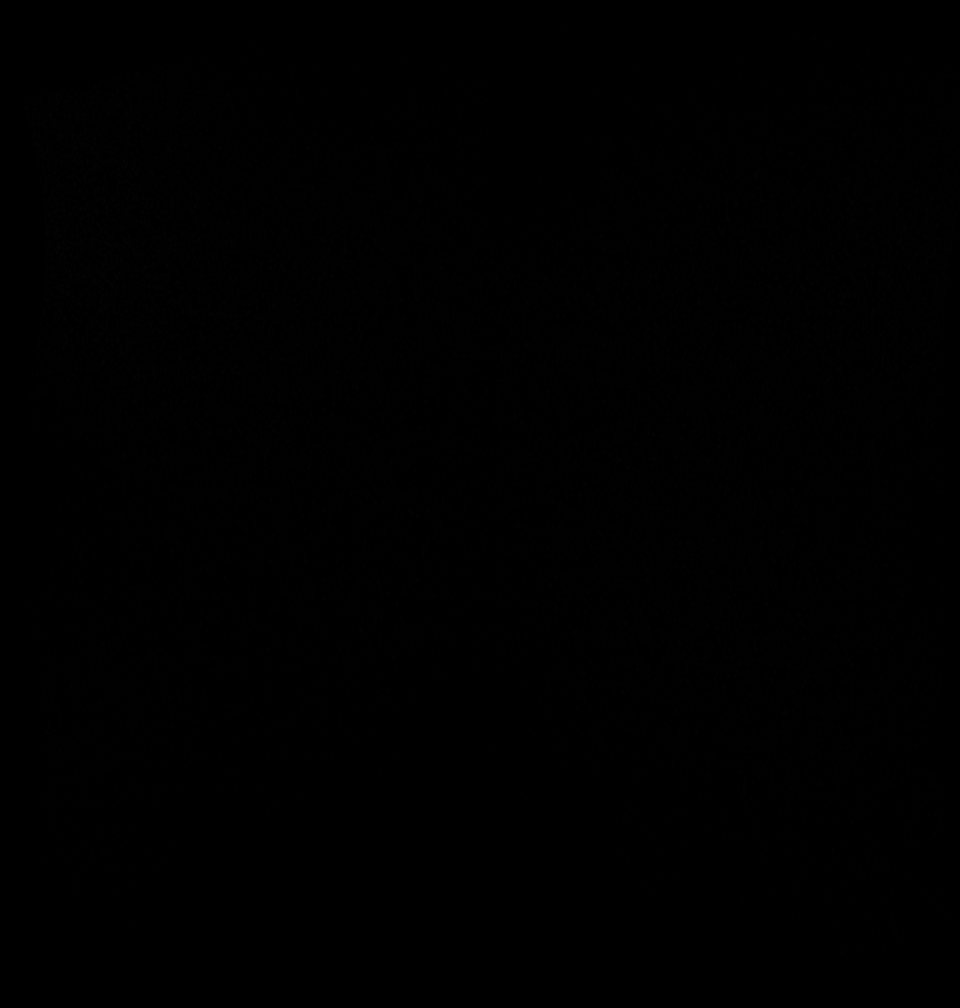

[Series 5: t2_tse_stir_cor_352_bilat_ · coronal · left · 4.0mm · 0.57mm/px · 3 of 34 slices shown]
[im 1/34]
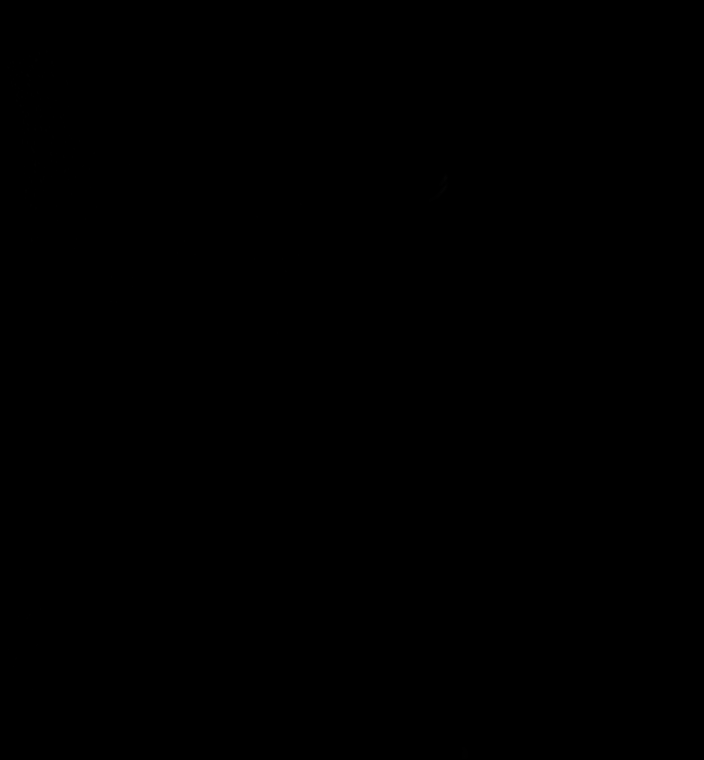
[im 17/34]
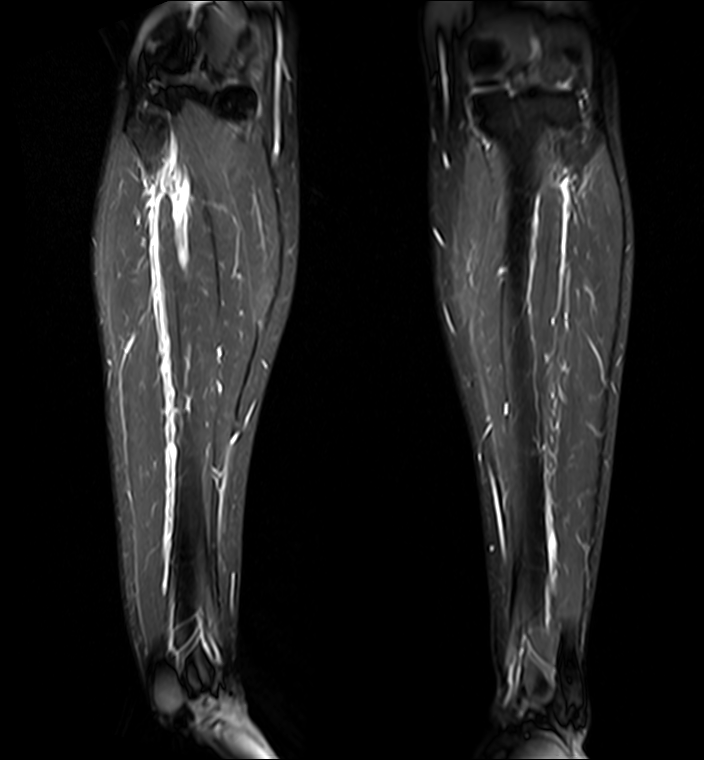
[im 34/34]
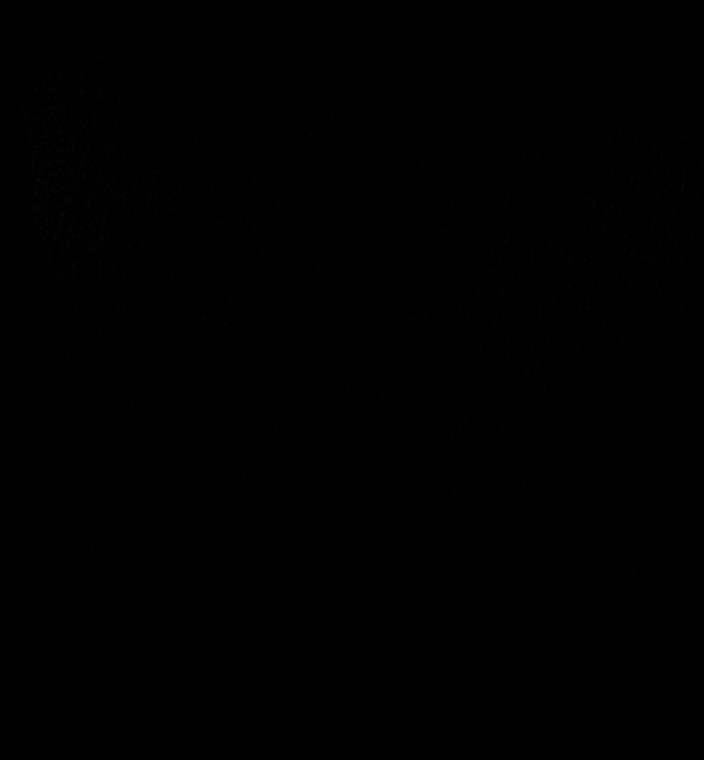

[Series 8: ax t1_comp_filt · axial · left · 4.0mm · 0.70mm/px · z∈[-235,+52]mm · 3 of 88 slices shown]
[im 15/88]
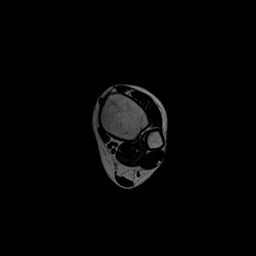
[im 44/88]
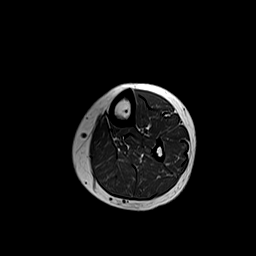
[im 73/88]
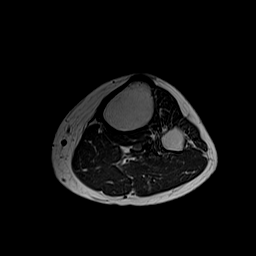

[Series 11: T2 · axial · left · 4.0mm · 0.66mm/px · z∈[-235,+52]mm · 3 of 88 slices shown]
[im 15/88]
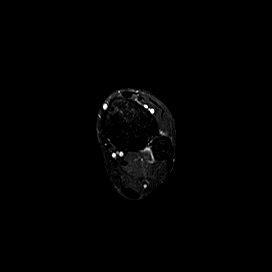
[im 44/88]
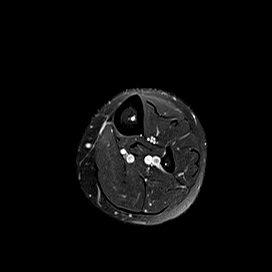
[im 73/88]
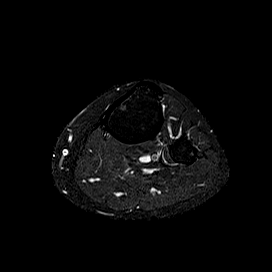

[12 of 40 positions shown; findings below may reference images not displayed]

FINDINGS: Bones/Joint/Cartilage

No fracture or dislocation. Normal alignment. No joint effusion. No
marrow signal abnormality.

Ligaments

Collateral ligaments are intact.

Muscles and Tendons

Small vertical myofascial tear along the anterior surface of the
left medial gastrocnemius muscle with a small sliver of a hematoma
between the medial gastrocnemius and soleus muscles. Mild adjacent
muscle strain of the medial gastrocnemius muscle.

Remainder of the muscles demonstrate no focal abnormality. No muscle
atrophy. Flexor, extensor, peroneal and Achilles tendons are intact.

Soft tissue
No fluid collection or hematoma. No soft tissue mass. Small cystic
area in Kager's fat measuring 7 mm likely reflecting a small
ganglion cyst.
IMPRESSION: Small vertical myofascial tear along the anterior surface of the
left medial gastrocnemius muscle with a small sliver of a hematoma
between the medial gastrocnemius and soleus muscles. Mild adjacent
muscle strain of the medial gastrocnemius muscle.

Intact Achilles tendon.

## 2022-02-24 ENCOUNTER — Ambulatory Visit
Admission: RE | Admit: 2022-02-24 | Discharge: 2022-02-24 | Disposition: A | Discharge status: Home / Self Care | Payer: 59 | Source: Ambulatory Visit | Attending: Family Medicine | Admitting: Family Medicine

## 2022-02-24 DIAGNOSIS — Z1231 Encounter for screening mammogram for malignant neoplasm of breast: Secondary | ICD-10-CM | POA: Insufficient documentation

## 2022-03-10 DIAGNOSIS — R790 Abnormal level of blood mineral: Secondary | ICD-10-CM | POA: Diagnosis not present

## 2022-03-11 ENCOUNTER — Encounter: Payer: Self-pay | Admitting: Family Medicine

## 2022-03-11 LAB — CBC WITH DIFFERENTIAL/PLATELET
Absolute Monocytes: 437 cells/uL (ref 200–950)
Basophils Absolute: 28 cells/uL (ref 0–200)
Basophils Relative: 0.6 %
Eosinophils Absolute: 120 cells/uL (ref 15–500)
Eosinophils Relative: 2.6 %
HCT: 46.3 % — ABNORMAL HIGH (ref 35.0–45.0)
Hemoglobin: 15.4 g/dL (ref 11.7–15.5)
Lymphs Abs: 1412 cells/uL (ref 850–3900)
MCH: 31.5 pg (ref 27.0–33.0)
MCHC: 33.3 g/dL (ref 32.0–36.0)
MCV: 94.7 fL (ref 80.0–100.0)
MPV: 10.3 fL (ref 7.5–12.5)
Monocytes Relative: 9.5 %
Neutro Abs: 2604 cells/uL (ref 1500–7800)
Neutrophils Relative %: 56.6 %
Platelets: 277 10*3/uL (ref 140–400)
RBC: 4.89 10*6/uL (ref 3.80–5.10)
RDW: 11.8 % (ref 11.0–15.0)
Total Lymphocyte: 30.7 %
WBC: 4.6 10*3/uL (ref 3.8–10.8)

## 2022-03-11 LAB — IRON,TIBC AND FERRITIN PANEL
%SAT: 49 % (calc) — ABNORMAL HIGH (ref 16–45)
Ferritin: 13 ng/mL — ABNORMAL LOW (ref 16–232)
Iron: 166 ug/dL (ref 40–190)
TIBC: 337 mcg/dL (calc) (ref 250–450)

## 2022-03-12 ENCOUNTER — Other Ambulatory Visit: Payer: Self-pay | Admitting: Family Medicine

## 2022-03-12 DIAGNOSIS — E611 Iron deficiency: Secondary | ICD-10-CM

## 2022-06-16 ENCOUNTER — Other Ambulatory Visit: Payer: Self-pay | Admitting: Family Medicine

## 2022-06-16 DIAGNOSIS — E661 Drug-induced obesity: Secondary | ICD-10-CM | POA: Diagnosis not present

## 2022-06-17 LAB — CBC WITH DIFFERENTIAL/PLATELET
Absolute Monocytes: 515 cells/uL (ref 200–950)
Basophils Absolute: 37 cells/uL (ref 0–200)
Basophils Relative: 0.6 %
Eosinophils Absolute: 211 cells/uL (ref 15–500)
Eosinophils Relative: 3.4 %
HCT: 44.7 % (ref 35.0–45.0)
Hemoglobin: 15.3 g/dL (ref 11.7–15.5)
Lymphs Abs: 1587 cells/uL (ref 850–3900)
MCH: 31.9 pg (ref 27.0–33.0)
MCHC: 34.2 g/dL (ref 32.0–36.0)
MCV: 93.1 fL (ref 80.0–100.0)
MPV: 10.5 fL (ref 7.5–12.5)
Monocytes Relative: 8.3 %
Neutro Abs: 3850 cells/uL (ref 1500–7800)
Neutrophils Relative %: 62.1 %
Platelets: 294 10*3/uL (ref 140–400)
RBC: 4.8 10*6/uL (ref 3.80–5.10)
RDW: 12.1 % (ref 11.0–15.0)
Total Lymphocyte: 25.6 %
WBC: 6.2 10*3/uL (ref 3.8–10.8)

## 2022-06-17 LAB — IRON,TIBC AND FERRITIN PANEL
%SAT: 43 % (calc) (ref 16–45)
Ferritin: 27 ng/mL (ref 16–232)
Iron: 139 ug/dL (ref 40–190)
TIBC: 323 mcg/dL (calc) (ref 250–450)

## 2022-06-23 ENCOUNTER — Encounter: Payer: Self-pay | Admitting: Certified Nurse Midwife

## 2022-06-26 ENCOUNTER — Ambulatory Visit (INDEPENDENT_AMBULATORY_CARE_PROVIDER_SITE_OTHER): Payer: Commercial Managed Care - PPO | Admitting: Certified Nurse Midwife

## 2022-06-26 ENCOUNTER — Encounter: Payer: Self-pay | Admitting: Certified Nurse Midwife

## 2022-06-26 VITALS — BP 102/66 | HR 71 | Resp 16 | Ht 66.0 in | Wt 146.1 lb

## 2022-06-26 DIAGNOSIS — Z01419 Encounter for gynecological examination (general) (routine) without abnormal findings: Secondary | ICD-10-CM | POA: Diagnosis not present

## 2022-06-26 DIAGNOSIS — Z1231 Encounter for screening mammogram for malignant neoplasm of breast: Secondary | ICD-10-CM

## 2022-06-26 NOTE — Patient Instructions (Signed)
Preventive Care 43-43 Years Old, Female Preventive care refers to lifestyle choices and visits with your health care provider that can promote health and wellness. Preventive care visits are also called wellness exams. What can I expect for my preventive care visit? Counseling Your health care provider may ask you questions about your: Medical history, including: Past medical problems. Family medical history. Pregnancy history. Current health, including: Menstrual cycle. Method of birth control. Emotional well-being. Home life and relationship well-being. Sexual activity and sexual health. Lifestyle, including: Alcohol, nicotine or tobacco, and drug use. Access to firearms. Diet, exercise, and sleep habits. Work and work environment. Sunscreen use. Safety issues such as seatbelt and bike helmet use. Physical exam Your health care provider will check your: Height and weight. These may be used to calculate your BMI (body mass index). BMI is a measurement that tells if you are at a healthy weight. Waist circumference. This measures the distance around your waistline. This measurement also tells if you are at a healthy weight and may help predict your risk of certain diseases, such as type 2 diabetes and high blood pressure. Heart rate and blood pressure. Body temperature. Skin for abnormal spots. What immunizations do I need?  Vaccines are usually given at various ages, according to a schedule. Your health care provider will recommend vaccines for you based on your age, medical history, and lifestyle or other factors, such as travel or where you work. What tests do I need? Screening Your health care provider may recommend screening tests for certain conditions. This may include: Lipid and cholesterol levels. Diabetes screening. This is done by checking your blood sugar (glucose) after you have not eaten for a while (fasting). Pelvic exam and Pap test. Hepatitis B test. Hepatitis C  test. HIV (human immunodeficiency virus) test. STI (sexually transmitted infection) testing, if you are at risk. Lung cancer screening. Colorectal cancer screening. Mammogram. Talk with your health care provider about when you should start having regular mammograms. This may depend on whether you have a family history of breast cancer. BRCA-related cancer screening. This may be done if you have a family history of breast, ovarian, tubal, or peritoneal cancers. Bone density scan. This is done to screen for osteoporosis. Talk with your health care provider about your test results, treatment options, and if necessary, the need for more tests. Follow these instructions at home: Eating and drinking  Eat a diet that includes fresh fruits and vegetables, whole grains, lean protein, and low-fat dairy products. Take vitamin and mineral supplements as recommended by your health care provider. Do not drink alcohol if: Your health care provider tells you not to drink. You are pregnant, may be pregnant, or are planning to become pregnant. If you drink alcohol: Limit how much you have to 43-1 drink a day. Know how much alcohol is in your drink. In the [place] one drink equals one 12 oz bottle of beer (355 mL), one 5 oz glass of wine (148 mL), or one 1 oz glass of hard liquor (44 mL). Lifestyle Brush your teeth every morning and night with fluoride toothpaste. Floss one time each day. Exercise for at least 30 minutes 5 or more days each week. Do not use any products that contain nicotine or tobacco. These products include cigarettes, chewing tobacco, and vaping devices, such as e-cigarettes. If you need help quitting, ask your health care provider. Do not use drugs. If you are sexually active, practice safe sex. Use a condom or other form of protection to   prevent STIs. If you do not wish to become pregnant, use a form of birth control. If you plan to become pregnant, see your health care provider for a  prepregnancy visit. Take aspirin only as told by your health care provider. Make sure that you understand how much to take and what form to take. Work with your health care provider to find out whether it is safe and beneficial for you to take aspirin daily. Find healthy ways to manage stress, such as: Meditation, yoga, or listening to music. Journaling. Talking to a trusted person. Spending time with friends and family. Minimize exposure to UV radiation to reduce your risk of skin cancer. Safety Always wear your seat belt while driving or riding in a vehicle. Do not drive: If you have been drinking alcohol. Do not ride with someone who has been drinking. When you are tired or distracted. While texting. If you have been using any mind-altering substances or drugs. Wear a helmet and other protective equipment during sports activities. If you have firearms in your house, make sure you follow all gun safety procedures. Seek help if you have been physically or sexually abused. What's next? Visit your health care provider once a year for an annual wellness visit. Ask your health care provider how often you should have your eyes and teeth checked. Stay up to date on all vaccines. This information is not intended to replace advice given to you by your health care provider. Make sure you discuss any questions you have with your health care provider. Document Revised: 10/30/2020 Document Reviewed: 10/30/2020 Elsevier Patient Education  2023 [employer] Inc[employer]  

## 2022-06-26 NOTE — Progress Notes (Signed)
GYNECOLOGY ANNUAL PREVENTATIVE CARE ENCOUNTER NOTE  History:     Christine Ball is a 42 y.o. G27P2002 female here for a routine annual gynecologic exam.  Current complaints: none.   Denies abnormal vaginal bleeding, discharge, pelvic pain, problems with intercourse or other gynecologic concerns.     Social Relationship: married  Living: spouse and children Work: volunteers at [employer]  Exercise: 3 x week for 1 hr Smoke/Alcohol/drug use: rare alcohol use, denies smoking/vaping/drug use.   Gynecologic History Patient's last menstrual period was 06/12/2022 (exact date). Contraception: vasectomy/IUD for cycle control Mirena 06/2021 Last Pap: 01/16/2019. Results were: normal with negative HPV Last mammogram: 02/15/2022. Results were: normal  Obstetric History OB History  Gravida Para Term Preterm AB Living  2 2 2     2  $ SAB IAB Ectopic Multiple Live Births          2    # Outcome Date GA Lbr Len/2nd Weight Sex Delivery Anes PTL Lv  2 Term 07/21/07   8 lb 10 oz (3.912 kg) M Vag-Spont  N LIV  1 Term 08/13/05   9 lb 3 oz (4.167 kg) F Vag-Spont  N LIV    Past Medical History:  Diagnosis Date   Anxiety    Chronic insomnia    Eczema    Right hip pain     Past Surgical History:  Procedure Laterality Date   LASIK Bilateral 10/23/2021   WISDOM TOOTH EXTRACTION      Current Outpatient Medications on File Prior to Visit  Medication Sig Dispense Refill   cholecalciferol (VITAMIN D3) 25 MCG (1000 UNIT) tablet Take 1,000 Units by mouth daily.     [facility] MELATONIN GUMMIES PO Take 1 each by mouth every evening. It is a combo of melatonin 84m  and CBD 10 mg     doxylamine, Sleep, (UNISOM) 25 MG tablet Take 12.5 mg by mouth every evening.     Ferrous Sulfate (IRON PO) Take 1 tablet by mouth as needed.     levonorgestrel (MIRENA, 52 MG,) 20 MCG/DAY IUD 1 each by Intrauterine route once. INSERTED 07/02/21     Magnesium 200 MG TABS Take 200 mg by mouth at bedtime.      Multiple Vitamin (MULTIVITAMIN) tablet Take 1 tablet by mouth daily.     Omega-3 Fatty Acids (FISH OIL) 1000 MG CAPS Take 2 each by mouth daily.     psyllium (METAMUCIL) 58.6 % packet Take 1 packet by mouth daily. (Patient not taking: Reported on 06/26/2022)     No current facility-administered medications on file prior to visit.    Allergies  Allergen Reactions   Penicillins Hives   Sulfa Antibiotics Hives    Social History:  reports that she has never smoked. She has never used smokeless tobacco. She reports current alcohol use of about 1.0 standard drink of alcohol per week. She reports that she does not use drugs.  Family History  Problem Relation Age of Onset   Osteoporosis Mother    Breast cancer Mother [phone]  Other Father    Metabolic syndrome Father    Other Paternal Grandmother        Polio   Cancer Paternal Grandfather     The following portions of the patient's history were reviewed and updated as appropriate: allergies, current medications, past family history, past medical history, past social history, past surgical history and problem list.  Review of Systems Pertinent items noted in HPI and  remainder of comprehensive ROS otherwise negative.  Physical Exam:  BP 102/66   Pulse 71   Resp 16   Ht 5' 6"$  (1.676 m)   Wt 146 lb 1.6 oz (66.3 kg)   LMP 06/12/2022 (Exact Date)   BMI 23.58 kg/m  CONSTITUTIONAL: Well-developed, well-nourished female in no acute distress.  HENT:  Normocephalic, atraumatic, External right and left ear normal. Oropharynx is clear and moist EYES: Conjunctivae and EOM are normal. Pupils are equal, round, and reactive to light. No scleral icterus.  NECK: Normal range of motion, supple, no masses.  Normal thyroid.  SKIN: Skin is warm and dry. No rash noted. Not diaphoretic. No erythema. No pallor. MUSCULOSKELETAL: Normal range of motion. No tenderness.  No cyanosis, clubbing, or edema.  2+ distal pulses. NEUROLOGIC: Alert and oriented to person,  place, and time. Normal reflexes, muscle tone coordination.  PSYCHIATRIC: Normal mood and affect. Normal behavior. Normal judgment and thought content. CARDIOVASCULAR: Normal heart rate noted, regular rhythm RESPIRATORY: Clear to auscultation bilaterally. Effort and breath sounds normal, no problems with respiration noted. BREASTS: Symmetric in size. No masses, tenderness, skin changes, nipple drainage, or lymphadenopathy bilaterally.  ABDOMEN: Soft, no distention noted.  No tenderness, rebound or guarding.  PELVIC: Normal appearing external genitalia and urethral meatus; normal appearing vaginal mucosa and cervix.  No abnormal discharge noted.  Pap smear not due. IUD strings present.  Normal uterine size, no other palpable masses, no uterine or adnexal tenderness.  .   Assessment and Plan:    1. Women's annual routine gynecological examination  Pap: not due Mammogram : ordered  Labs: none Refills: none  Referral: none  Routine preventative health maintenance measures emphasized. Please refer to After Visit Summary for other counseling recommendations.      Philip Aspen, [facility]  [facility]

## 2022-12-03 NOTE — Progress Notes (Deleted)
Name: Christine Ball   MRN: [id number]    DOB: March 27, 1981   Date:12/03/2022       Progress Note  Subjective  Chief Complaint  Bug Bite  HPI  *** Patient Active Problem List   Diagnosis Date Noted   Greater trochanteric pain syndrome of right lower extremity 03/11/2021   Strain of gastrocnemius muscle of left lower extremity 03/11/2021   Insomnia, persistent 03/14/2015   GAD (generalized anxiety disorder) 03/14/2015   Dermatitis, eczematoid 03/14/2015   Nonallopathic lesion of lumbosacral region 10/08/2014   Gluteal tendinitis of right buttock 08/21/2014    Past Surgical History:  Procedure Laterality Date   LASIK Bilateral 10/23/2021   WISDOM TOOTH EXTRACTION      Family History  Problem Relation Age of Onset   Osteoporosis Mother    Breast cancer Mother 13   Other Father    Metabolic syndrome Father    Other Paternal Grandmother        Polio   Cancer Paternal Grandfather     Social History   Tobacco Use   Smoking status: Never   Smokeless tobacco: Never  Substance Use Topics   Alcohol use: Yes    Alcohol/week: 1.0 standard drink of alcohol    Types: 1 Standard drinks or equivalent per week     Current Outpatient Medications:    cholecalciferol (VITAMIN D3) 25 MCG (1000 UNIT) tablet, Take 1,000 Units by mouth daily., Disp: , Rfl:    doxylamine, Sleep, (UNISOM) 25 MG tablet, Take 12.5 mg by mouth every evening., Disp: , Rfl:    Ferrous Sulfate (IRON PO), Take 1 tablet by mouth as needed., Disp: , Rfl:    levonorgestrel (MIRENA, 52 MG,) 20 MCG/DAY IUD, 1 each by Intrauterine route once. INSERTED 07/02/21, Disp: , Rfl:    Magnesium 200 MG TABS, Take 200 mg by mouth at bedtime., Disp: , Rfl:    Multiple Vitamin (MULTIVITAMIN) tablet, Take 1 tablet by mouth daily., Disp: , Rfl:    Omega-3 Fatty Acids (FISH OIL) 1000 MG CAPS, Take 2 each by mouth daily., Disp: , Rfl:   Allergies  Allergen Reactions   Penicillins Hives   Sulfa Antibiotics Hives    I  personally reviewed active problem list, medication list, allergies, family history, social history, health maintenance with the patient/caregiver today.   ROS  ***  Objective  There were no vitals filed for this visit.  There is no height or weight on file to calculate BMI.  Physical Exam ***  No results found for this or any previous visit (from the past 2160 hour(s)).   PHQ2/9:    01/07/2022    8:01 AM 05/20/2021    9:21 AM 03/11/2021    9:30 AM 02/28/2021    1:05 PM 09/17/2020    9:45 AM  Depression screen PHQ 2/9  Decreased Interest 0 0 0 0 0  Down, Depressed, Hopeless 0 1 1 0 1  PHQ - 2 Score 0 1 1 0 1  Altered sleeping 0 0 0  0  Tired, decreased energy 0 0 0  0  Change in appetite 0 0 0  0  Feeling bad or failure about yourself  0 1 1  0  Trouble concentrating 0 0 0  0  Moving slowly or fidgety/restless 0 0 0  0  Suicidal thoughts 0 0 0  0  PHQ-9 Score 0 2 2  1   Difficult doing work/chores  Not difficult at all Not difficult at all  phq 9 is {gen pos [id number]}   Fall Risk:    01/07/2022    8:00 AM 05/20/2021    9:21 AM 04/04/2021    8:38 AM 03/11/2021    9:29 AM 02/28/2021    1:05 PM  Fall Risk   Falls in the past year? 0 0 0 0 0  Number falls in past yr: 0 0 0 0 0  Injury with Fall? 0 0 0 0 0  Risk for fall due to : No Fall Risks Orthopedic patient Orthopedic patient Orthopedic patient   Follow up Falls prevention discussed Falls evaluation completed Falls evaluation completed Falls evaluation completed Falls evaluation completed      Functional Status Survey:      Assessment & Plan  *** There are no diagnoses linked to this encounter.

## 2022-12-04 ENCOUNTER — Ambulatory Visit: Payer: Commercial Managed Care - PPO | Admitting: Family Medicine

## 2023-01-07 NOTE — Progress Notes (Signed)
Name: Christine Ball   MRN: [id number]    DOB: 1980-10-15   Date:01/08/2023       Progress Note  Subjective  Chief Complaint  CPE  HPI  Patient presents for annual CPE.  Diet: vegetarian mostly but has introduced some meat due to iron deficiency anemia  Exercise: still physically active , works out every other for about 75 minutes   Last Eye Exam: up to date  - she had Lasik surgery Summer 2024 Last Dental Exam: up to date  [employer] Visit from 01/08/2023 in [facility]  AUDIT-C Score 2      Depression: Phq 9 is  negative    01/08/2023    8:21 AM 01/07/2022    8:01 AM 05/20/2021    9:21 AM 03/11/2021    9:30 AM 02/28/2021    1:05 PM  Depression screen PHQ 2/9  Decreased Interest 0 0 0 0 0  Down, Depressed, Hopeless 1 0 1 1 0  PHQ - 2 Score 1 0 1 1 0  Altered sleeping 0 0 0 0   Tired, decreased energy 0 0 0 0   Change in appetite 0 0 0 0   Feeling bad or failure about yourself  0 0 1 1   Trouble concentrating 0 0 0 0   Moving slowly or fidgety/restless 0 0 0 0   Suicidal thoughts 0 0 0 0   PHQ-9 Score 1 0 2 2   Difficult doing work/chores Not difficult at all  Not difficult at all Not difficult at all    Hypertension: BP Readings from Last 3 Encounters:  01/08/23 106/66  06/26/22 102/66  01/07/22 112/68   Obesity: Wt Readings from Last 3 Encounters:  01/08/23 143 lb 9.6 oz (65.1 kg)  06/26/22 146 lb 1.6 oz (66.3 kg)  01/07/22 144 lb (65.3 kg)   BMI Readings from Last 3 Encounters:  01/08/23 23.53 kg/m  06/26/22 23.58 kg/m  01/07/22 23.96 kg/m     Vaccines:   HPV: Aged Out Tdap: 09/17/2020 Shingrix: N/A Pneumonia: N/A Flu: 02/28/2022 COVID-19:3 doses completed   Hep C Screening: 09/17/2020 STD testing and prevention (HIV/chl/gon/syphilis): N/A Intimate partner violence: negative screen  Sexual History : one partner , married  Menstrual History/LMP/Abnormal Bleeding: IUD 2023 Discussed importance of follow  up if any post-menopausal bleeding: not applicable  Incontinence Symptoms: negative for symptoms   Breast cancer:  - Last Mammogram: 02/24/2022 - BRCA gene screening: N/A  Osteoporosis Prevention : Discussed high calcium and vitamin D supplementation, weight bearing exercises Bone density :not applicable   Cervical cancer screening: 01/16/2019  Skin cancer: Discussed monitoring for atypical lesions  Colorectal cancer: N/A   Lung cancer:  Low Dose CT Chest recommended if Age 76-80 years, 20 pack-year currently smoking OR have quit w/in 15years. Patient does not qualify for screen   ECG: we will hold off for now   Advanced Care Planning: A voluntary discussion about advance care planning including the explanation and discussion of advance directives.  Discussed health care proxy and Living will, and the patient was able to identify a health care proxy as Royce Macadamia.  Patient  has living will and power of attorney of health care   Lipids: Lab Results  Component Value Date   CHOL 164 01/05/2022   CHOL 165 09/17/2020   CHOL 169 11/16/2018   Lab Results  Component Value Date   HDL 51 01/05/2022   HDL 55 09/17/2020   HDL  54 11/16/2018   Lab Results  Component Value Date   LDLCALC 91 01/05/2022   LDLCALC 91 09/17/2020   LDLCALC 96 11/16/2018   Lab Results  Component Value Date   TRIG 123 01/05/2022   TRIG 91 09/17/2020   TRIG 95 11/16/2018   Lab Results  Component Value Date   CHOLHDL 3.2 01/05/2022   CHOLHDL 3.0 09/17/2020   CHOLHDL 3.1 11/16/2018   No results found for: "LDLDIRECT"  Glucose: Glucose  Date Value Ref Range Status  11/16/2018 90 65 - 99 mg/dL Final  16/02/9603 87 65 - 99 mg/dL Final   Glucose, Bld  Date Value Ref Range Status  01/05/2022 81 65 - 99 mg/dL Final    Comment:    .            Fasting reference interval .   09/17/2020 83 65 - 99 mg/dL Final    Comment:    .            Fasting reference interval .     Patient Active  Problem List   Diagnosis Date Noted   Greater trochanteric pain syndrome of right lower extremity 03/11/2021   Strain of gastrocnemius muscle of left lower extremity 03/11/2021   Insomnia, persistent 03/14/2015   GAD (generalized anxiety disorder) 03/14/2015   Dermatitis, eczematoid 03/14/2015   Nonallopathic lesion of lumbosacral region 10/08/2014   Gluteal tendinitis of right buttock 08/21/2014    Past Surgical History:  Procedure Laterality Date   LASIK Bilateral 10/23/2021   WISDOM TOOTH EXTRACTION      Family History  Problem Relation Age of Onset   Osteoporosis Mother    Breast cancer Mother 25   Other Father    Metabolic syndrome Father    Other Paternal Grandmother        Polio   Cancer Paternal Grandfather     Social History   Socioeconomic History   Marital status: Married    Spouse name: Alberteen Schupp   Number of children: 2   Years of education: 16   Highest education level: Bachelor's degree (e.g., BA, AB, BS)  Occupational History   Occupation: stay at home mother   Tobacco Use   Smoking status: Never   Smokeless tobacco: Never  Vaping Use   Vaping status: Never Used  Substance and Sexual Activity   Alcohol use: Yes    Alcohol/week: 1.0 standard drink of alcohol    Types: 1 Standard drinks or equivalent per week   Drug use: Never   Sexual activity: Yes    Partners: Male    Birth control/protection: Surgical, None  Other Topics Concern   Not on file  Social History Narrative   Daughter had blebs and pneumothorax twice in the Spring of 2022   Social Determinants of Health   Financial Resource Strain: Low Risk  (01/08/2023)   Overall Financial Resource Strain (CARDIA)    Difficulty of Paying Living Expenses: Not hard at all  Food Insecurity: Unknown (01/08/2023)   Hunger Vital Sign    Worried About Running Out of Food in the Last Year: Not on file    Ran Out of Food in the Last Year: Never true  Transportation Needs: No Transportation Needs  (01/08/2023)   PRAPARE - Administrator, Civil Service (Medical): No    Lack of Transportation (Non-Medical): No  Physical Activity: Sufficiently Active (01/08/2023)   Exercise Vital Sign    Days of Exercise per Week: 4 days  Minutes of Exercise per Session: 70 min  Stress: Stress Concern Present (01/08/2023)   [employer] of Occupational Health - Occupational Stress Questionnaire    Feeling of Stress : To some extent  Social Connections: Socially Integrated (01/08/2023)   Social Connection and Isolation Panel [NHANES]    Frequency of Communication with Friends and Family: More than three times a week    Frequency of Social Gatherings with Friends and Family: Three times a week    Attends Religious Services: More than 4 times per year    Active Member of Clubs or Organizations: Yes    Attends Banker Meetings: More than 4 times per year    Marital Status: Married  Catering manager Violence: Not At Risk (01/08/2023)   Humiliation, Afraid, Rape, and Kick questionnaire    Fear of Current or Ex-Partner: No    Emotionally Abused: No    Physically Abused: No    Sexually Abused: No     Current Outpatient Medications:    cholecalciferol (VITAMIN D3) 25 MCG (1000 UNIT) tablet, Take 1,000 Units by mouth daily., Disp: , Rfl:    doxylamine, Sleep, (UNISOM) 25 MG tablet, Take 12.5 mg by mouth every evening., Disp: , Rfl:    Ferrous Sulfate (IRON PO), Take 1 tablet by mouth as needed., Disp: , Rfl:    levonorgestrel (MIRENA, 52 MG,) 20 MCG/DAY IUD, 1 each by Intrauterine route once. INSERTED 07/02/21, Disp: , Rfl:    Magnesium 200 MG TABS, Take 200 mg by mouth at bedtime., Disp: , Rfl:    Multiple Vitamin (MULTIVITAMIN) tablet, Take 1 tablet by mouth daily., Disp: , Rfl:    Omega-3 Fatty Acids (FISH OIL) 1000 MG CAPS, Take 2 each by mouth daily., Disp: , Rfl:   Allergies  Allergen Reactions   Penicillins Hives   Sulfa Antibiotics Hives      ROS  Constitutional: Negative for fever or weight change.  Respiratory: Negative for cough and shortness of breath.   Cardiovascular: Negative for chest pain or palpitations.  Gastrointestinal: Negative for abdominal pain, no bowel changes.  Musculoskeletal: Negative for gait problem or joint swelling.  Skin: Negative for rash.  Neurological: Negative for dizziness or headache.  No other specific complaints in a complete review of systems (except as listed in HPI above).   Objective  Vitals:   01/08/23 0815  BP: 106/66  Pulse: 77  Resp: 14  Temp: 97.8 F (36.6 C)  TempSrc: Oral  SpO2: 99%  Weight: 143 lb 9.6 oz (65.1 kg)  Height: 5' 5.5" (1.664 m)    Body mass index is 23.53 kg/m.  Physical Exam  Constitutional: Patient appears well-developed and well-nourished. No distress.  HENT: Head: Normocephalic and atraumatic. Ears: B TMs ok, no erythema or effusion; Nose: Nose normal. Mouth/Throat: Oropharynx is clear and moist. No oropharyngeal exudate.  Eyes: Conjunctivae and EOM are normal. Pupils are equal, round, and reactive to light. No scleral icterus.  Neck: Normal range of motion. Neck supple. No JVD present. No thyromegaly present.  Cardiovascular: Normal rate, regular rhythm and normal heart sounds.  No murmur heard. No BLE edema. Pulmonary/Chest: Effort normal and breath sounds normal. No respiratory distress. Abdominal: Soft. Bowel sounds are normal, no distension. There is no tenderness. no masses Breast: no lumps or masses, no nipple discharge or rashes FEMALE GENITALIA:  Not done  RECTAL: not done  Musculoskeletal: Normal range of motion, no joint effusions. No gross deformities Neurological: he is alert and oriented to person, place, and  time. No cranial nerve deficit. Coordination, balance, strength, speech and gait are normal.  Skin: Skin is warm and dry. No rash noted. No erythema.  Psychiatric: Patient has a normal mood and affect. behavior is normal.  Judgment and thought content normal.    Fall Risk:    01/08/2023    8:19 AM 01/07/2022    8:00 AM 05/20/2021    9:21 AM 04/04/2021    8:38 AM 03/11/2021    9:29 AM  Fall Risk   Falls in the past year? 0 0 0 0 0  Number falls in past yr:  0 0 0 0  Injury with Fall?  0 0 0 0  Risk for fall due to : No Fall Risks No Fall Risks Orthopedic patient Orthopedic patient Orthopedic patient  Follow up Falls prevention discussed;Education provided;Falls evaluation completed Falls prevention discussed Falls evaluation completed Falls evaluation completed Falls evaluation completed     Functional Status Survey: Is the patient deaf or have difficulty hearing?: No Does the patient have difficulty seeing, even when wearing glasses/contacts?: No Does the patient have difficulty concentrating, remembering, or making decisions?: No Does the patient have difficulty walking or climbing stairs?: No Does the patient have difficulty dressing or bathing?: No Does the patient have difficulty doing errands alone such as visiting a doctor's office or shopping?: No   Assessment & Plan  1. Well adult exam   2. Iron deficiency  - CBC with Differential/Platelet - Iron, TIBC and Ferritin Panel  3. Low ferritin  - CBC with Differential/Platelet - Iron, TIBC and Ferritin Panel  4. Diabetes mellitus screening  - Hemoglobin A1c  5. Long-term use of high-risk medication  - COMPLETE METABOLIC PANEL WITH GFR  6. Lipid screening  - Lipid panel  7. Breast cancer screening by mammogram  - MM 3D SCREENING MAMMOGRAM BILATERAL BREAST; Future  8. Vegetarian diet  - B12 and Folate Panel - VITAMIN D 25 Hydroxy (Vit-D Deficiency, Fractures)    -USPSTF grade A and B recommendations reviewed with patient; age-appropriate recommendations, preventive care, screening tests, etc discussed and encouraged; healthy living encouraged; see AVS for patient education given to patient -Discussed importance of 150  minutes of physical activity weekly, eat two servings of fish weekly, eat one serving of tree nuts ( cashews, pistachios, pecans, almonds.Marland Kitchen) every other day, eat 6 servings of fruit/vegetables daily and drink plenty of water and avoid sweet beverages.   -Reviewed Health Maintenance: Yes.

## 2023-01-08 ENCOUNTER — Ambulatory Visit (INDEPENDENT_AMBULATORY_CARE_PROVIDER_SITE_OTHER): Payer: 59 | Admitting: Family Medicine

## 2023-01-08 ENCOUNTER — Encounter: Payer: Self-pay | Admitting: Family Medicine

## 2023-01-08 VITALS — BP 106/66 | HR 77 | Temp 97.8°F | Resp 14 | Ht 65.5 in | Wt 143.6 lb

## 2023-01-08 DIAGNOSIS — Z131 Encounter for screening for diabetes mellitus: Secondary | ICD-10-CM

## 2023-01-08 DIAGNOSIS — Z789 Other specified health status: Secondary | ICD-10-CM

## 2023-01-08 DIAGNOSIS — R790 Abnormal level of blood mineral: Secondary | ICD-10-CM

## 2023-01-08 DIAGNOSIS — Z79899 Other long term (current) drug therapy: Secondary | ICD-10-CM

## 2023-01-08 DIAGNOSIS — Z13220 Encounter for screening for lipoid disorders: Secondary | ICD-10-CM | POA: Diagnosis not present

## 2023-01-08 DIAGNOSIS — Z0001 Encounter for general adult medical examination with abnormal findings: Secondary | ICD-10-CM | POA: Diagnosis not present

## 2023-01-08 DIAGNOSIS — Z0000 Encounter for general adult medical examination without abnormal findings: Secondary | ICD-10-CM

## 2023-01-08 DIAGNOSIS — Z1231 Encounter for screening mammogram for malignant neoplasm of breast: Secondary | ICD-10-CM

## 2023-01-08 DIAGNOSIS — E611 Iron deficiency: Secondary | ICD-10-CM

## 2023-01-09 LAB — CBC WITH DIFFERENTIAL/PLATELET
Absolute Monocytes: 448 {cells}/uL (ref 200–950)
Basophils Absolute: 22 cells/uL (ref 0–200)
Basophils Relative: 0.4 %
Eosinophils Absolute: 119 cells/uL (ref 15–500)
Eosinophils Relative: 2.2 %
HCT: 45.1 % — ABNORMAL HIGH (ref 35.0–45.0)
Hemoglobin: 15 g/dL (ref 11.7–15.5)
Lymphs Abs: 1609 {cells}/uL (ref 850–3900)
MCH: 30.7 pg (ref 27.0–33.0)
MCHC: 33.3 g/dL (ref 32.0–36.0)
MCV: 92.4 fL (ref 80.0–100.0)
MPV: 10.2 fL (ref 7.5–12.5)
Monocytes Relative: 8.3 %
Neutro Abs: 3202 {cells}/uL (ref 1500–7800)
Neutrophils Relative %: 59.3 %
Platelets: 286 10*3/uL (ref 140–400)
RBC: 4.88 10*6/uL (ref 3.80–5.10)
RDW: 11.9 % (ref 11.0–15.0)
Total Lymphocyte: 29.8 %
WBC: 5.4 10*3/uL (ref 3.8–10.8)

## 2023-01-09 LAB — HEMOGLOBIN A1C
Hgb A1c MFr Bld: 5.4 %{Hb} (ref <5.7)
Mean Plasma Glucose: 108 mg/dL
eAG (mmol/L): 6 mmol/L

## 2023-01-09 LAB — LIPID PANEL
Cholesterol: 140 mg/dL (ref <200)
HDL: 40 mg/dL — ABNORMAL LOW (ref >=50)
LDL Cholesterol (Calc): 79 mg/dL
Non-HDL Cholesterol (Calc): 100 mg/dL (ref <130)
Total CHOL/HDL Ratio: 3.5 (calc) (ref <5.0)
Triglycerides: 111 mg/dL (ref <150)

## 2023-01-09 LAB — COMPLETE METABOLIC PANEL WITH GFR
AG Ratio: 1.7 (calc) (ref 1.0–2.5)
ALT: 43 U/L — ABNORMAL HIGH (ref 6–29)
AST: 20 U/L (ref 10–30)
Albumin: 4.4 g/dL (ref 3.6–5.1)
Alkaline phosphatase (APISO): 78 U/L (ref 31–125)
BUN: 16 mg/dL (ref 7–25)
CO2: 28 mmol/L (ref 20–32)
Calcium: 9.6 mg/dL (ref 8.6–10.2)
Chloride: 103 mmol/L (ref 98–110)
Creat: 0.76 mg/dL (ref 0.50–0.99)
Globulin: 2.6 g/dL (ref 1.9–3.7)
Glucose, Bld: 85 mg/dL (ref 65–99)
Potassium: 5 mmol/L (ref 3.5–5.3)
Sodium: 139 mmol/L (ref 135–146)
Total Bilirubin: 0.5 mg/dL (ref 0.2–1.2)
Total Protein: 7 g/dL (ref 6.1–8.1)
eGFR: 100 mL/min/{1.73_m2} (ref >=60)

## 2023-01-09 LAB — VITAMIN D 25 HYDROXY (VIT D DEFICIENCY, FRACTURES): Vit D, 25-Hydroxy: 81 ng/mL (ref 30–100)

## 2023-01-09 LAB — B12 AND FOLATE PANEL
Folate: 22.1 ng/mL
Vitamin B-12: 626 pg/mL (ref 200–1100)

## 2023-01-09 LAB — IRON,TIBC AND FERRITIN PANEL
%SAT: 54 % — ABNORMAL HIGH (ref 16–45)
Ferritin: 45 ng/mL (ref 16–232)
Iron: 154 ug/dL (ref 40–190)
TIBC: 287 ug/dL (ref 250–450)

## 2023-01-11 ENCOUNTER — Other Ambulatory Visit: Payer: Self-pay | Admitting: Family Medicine

## 2023-01-11 DIAGNOSIS — R748 Abnormal levels of other serum enzymes: Secondary | ICD-10-CM

## 2023-01-27 DIAGNOSIS — R748 Abnormal levels of other serum enzymes: Secondary | ICD-10-CM | POA: Diagnosis not present

## 2023-01-28 LAB — HEPATIC FUNCTION PANEL
AG Ratio: 1.6 (calc) (ref 1.0–2.5)
ALT: 18 U/L (ref 6–29)
AST: 17 U/L (ref 10–30)
Albumin: 4.1 g/dL (ref 3.6–5.1)
Alkaline phosphatase (APISO): 69 U/L (ref 31–125)
Bilirubin, Direct: 0.1 mg/dL (ref 0.0–0.2)
Globulin: 2.5 g/dL (ref 1.9–3.7)
Indirect Bilirubin: 0.3 mg/dL (ref 0.2–1.2)
Total Bilirubin: 0.4 mg/dL (ref 0.2–1.2)
Total Protein: 6.6 g/dL (ref 6.1–8.1)

## 2023-03-02 ENCOUNTER — Ambulatory Visit
Admission: RE | Admit: 2023-03-02 | Discharge: 2023-03-02 | Disposition: A | Discharge status: Home / Self Care | Payer: 59 | Source: Ambulatory Visit | Attending: Family Medicine | Admitting: Family Medicine

## 2023-03-02 ENCOUNTER — Other Ambulatory Visit: Payer: Self-pay

## 2023-03-02 DIAGNOSIS — Z1231 Encounter for screening mammogram for malignant neoplasm of breast: Secondary | ICD-10-CM | POA: Diagnosis not present

## 2023-03-02 MED ORDER — FLULAVAL 0.5 ML IM SUSY
0.5000 mL | PREFILLED_SYRINGE | Freq: Once | INTRAMUSCULAR | 0 refills | Status: AC
  Filled 2023-03-02: qty 0.5, 1d supply, fill #0

## 2023-03-02 MED ORDER — COMIRNATY 30 MCG/0.3ML IM SUSY
0.3000 mL | PREFILLED_SYRINGE | Freq: Once | INTRAMUSCULAR | 0 refills | Status: AC
  Filled 2023-03-02: qty 0.3, 1d supply, fill #0

## 2023-03-23 DIAGNOSIS — Z86018 Personal history of other benign neoplasm: Secondary | ICD-10-CM | POA: Diagnosis not present

## 2023-03-23 DIAGNOSIS — D1801 Hemangioma of skin and subcutaneous tissue: Secondary | ICD-10-CM | POA: Diagnosis not present

## 2023-03-23 DIAGNOSIS — L578 Other skin changes due to chronic exposure to nonionizing radiation: Secondary | ICD-10-CM | POA: Diagnosis not present

## 2023-03-23 DIAGNOSIS — Z808 Family history of malignant neoplasm of other organs or systems: Secondary | ICD-10-CM | POA: Diagnosis not present

## 2023-03-30 ENCOUNTER — Other Ambulatory Visit (HOSPITAL_BASED_OUTPATIENT_CLINIC_OR_DEPARTMENT_OTHER): Payer: Self-pay

## 2023-09-15 ENCOUNTER — Ambulatory Visit (INDEPENDENT_AMBULATORY_CARE_PROVIDER_SITE_OTHER): Admitting: Certified Nurse Midwife

## 2023-09-15 ENCOUNTER — Encounter: Payer: Self-pay | Admitting: Certified Nurse Midwife

## 2023-09-15 ENCOUNTER — Other Ambulatory Visit (HOSPITAL_COMMUNITY)
Admission: RE | Admit: 2023-09-15 | Discharge: 2023-09-15 | Disposition: A | Discharge status: Home / Self Care | Source: Ambulatory Visit | Attending: Certified Nurse Midwife | Admitting: Certified Nurse Midwife

## 2023-09-15 VITALS — BP 107/71 | HR 69 | Ht 65.0 in | Wt 145.7 lb

## 2023-09-15 DIAGNOSIS — Z01419 Encounter for gynecological examination (general) (routine) without abnormal findings: Secondary | ICD-10-CM | POA: Insufficient documentation

## 2023-09-15 DIAGNOSIS — Z124 Encounter for screening for malignant neoplasm of cervix: Secondary | ICD-10-CM

## 2023-09-15 DIAGNOSIS — Z1231 Encounter for screening mammogram for malignant neoplasm of breast: Secondary | ICD-10-CM

## 2023-09-15 NOTE — Progress Notes (Signed)
 GYNECOLOGY ANNUAL PREVENTATIVE CARE ENCOUNTER NOTE  History:     Christine Ball is a 43 y.o. G53P2002 female here for a routine annual gynecologic exam.  Current complaints: patient would like to address nutrition and wellness.   Denies abnormal vaginal bleeding, discharge, pelvic pain, problems with intercourse or other gynecologic concerns.     Social Relationship:married Living:husband Work:unemployed  Exercise:yes 3-5x a week Smoke/Alcohol/drug use:no tobacco/occasional alcohol/no history of drug use.  Gynecologic History No LMP recorded. (Menstrual status: IUD). Contraception: IUD Last Pap: 01/16/2019. Results were: normal with negative HPV Last mammogram: 03/02/2023. Results were: normal  Obstetric History OB History  Gravida Para Term Preterm AB Living  2 2 2   2   SAB IAB Ectopic Multiple Live Births      2    # Outcome Date GA Lbr Len/2nd Weight Sex Type Anes PTL Lv  2 Term 07/21/07   8 lb 10 oz (3.912 kg) M Vag-Spont  N LIV  1 Term 08/13/05   9 lb 3 oz (4.167 kg) F Vag-Spont  N LIV    Past Medical History:  Diagnosis Date   Anxiety    Chronic insomnia    Eczema    Right hip pain     Past Surgical History:  Procedure Laterality Date   LASIK Bilateral 10/23/2021   WISDOM TOOTH EXTRACTION      Current Outpatient Medications on File Prior to Visit  Medication Sig Dispense Refill   cholecalciferol (VITAMIN D3) 25 MCG (1000 UNIT) tablet Take 1,000 Units by mouth daily.     doxylamine, Sleep, (UNISOM) 25 MG tablet Take 12.5 mg by mouth every evening.     Ferrous Sulfate (IRON PO) Take 1 tablet by mouth as needed.     levonorgestrel  (MIRENA , 52 MG,) 20 MCG/DAY IUD 1 each by Intrauterine route once. INSERTED 07/02/21     Magnesium 200 MG TABS Take 200 mg by mouth at bedtime.     Multiple Vitamin (MULTIVITAMIN) tablet Take 1 tablet by mouth daily.     Omega-3 Fatty Acids (FISH OIL) 1000 MG CAPS Take 2 each by mouth daily.     No current  facility-administered medications on file prior to visit.    Allergies  Allergen Reactions   Penicillins Hives   Sulfa  Antibiotics Hives    Social History:  reports that she has never smoked. She has never used smokeless tobacco. She reports current alcohol use of about 1.0 standard drink of alcohol per week. She reports that she does not use drugs.  Family History  Problem Relation Age of Onset   Osteoporosis Mother    Breast cancer Mother 54   Other Father    Metabolic syndrome Father    Other Paternal Grandmother        Polio   Cancer Paternal Grandfather     The following portions of the patient's history were reviewed and updated as appropriate: allergies, current medications, past family history, past medical history, past social history, past surgical history and problem list.  Review of Systems Pertinent items noted in HPI and remainder of comprehensive ROS otherwise negative.  Physical Exam:  There were no vitals taken for this visit. CONSTITUTIONAL: Well-developed, well-nourished female in no acute distress.  HENT:  Normocephalic, atraumatic, External right and left ear normal. Oropharynx is clear and moist EYES: Conjunctivae and EOM are normal. Pupils are equal, round, and reactive to light. No scleral icterus.  NECK: Normal range of motion, supple, no  masses.  Normal thyroid .  SKIN: Skin is warm and dry. No rash noted. Not diaphoretic. No erythema. No pallor. MUSCULOSKELETAL: Normal range of motion. No tenderness.  No cyanosis, clubbing, or edema.  2+ distal pulses. NEUROLOGIC: Alert and oriented to person, place, and time. Normal reflexes, muscle tone coordination.  PSYCHIATRIC: Normal mood and affect. Normal behavior. Normal judgment and thought content. CARDIOVASCULAR: Normal heart rate noted, regular rhythm RESPIRATORY: Clear to auscultation bilaterally. Effort and breath sounds normal, no problems with respiration noted. BREASTS: Symmetric in size. No masses,  tenderness, skin changes, nipple drainage, or lymphadenopathy bilaterally.  ABDOMEN: Soft, no distention noted.  No tenderness, rebound or guarding.  PELVIC: Normal appearing external genitalia and urethral meatus; normal appearing vaginal mucosa and cervix.  No abnormal discharge noted.  Pap smear obtained. Strings present. Contact bleeding with pap.   Normal uterine size, no other palpable masses, no uterine or adnexal tenderness.  .   Assessment and Plan:    1. Well woman exam with routine gynecological exam (Primary)  Pap: Will follow up results of pap smear and manage accordingly. Mammogram : ordered Labs: none due Refills: none Referral: none  Routine preventative health maintenance measures emphasized. Please refer to After Visit Summary for other counseling recommendations.      Alise Appl, CNM [facility]  [facility],  [facility]

## 2023-09-20 LAB — CYTOLOGY - PAP
Comment: NEGATIVE
Diagnosis: NEGATIVE
Diagnosis: REACTIVE
High risk HPV: NEGATIVE

## 2023-09-27 ENCOUNTER — Encounter: Payer: Self-pay | Admitting: Obstetrics

## 2024-01-10 ENCOUNTER — Encounter: Payer: Self-pay | Admitting: Family Medicine

## 2024-02-21 NOTE — Patient Instructions (Signed)
 Preventive Care 58-44 Years Old, Female  Preventive care refers to lifestyle choices and visits with your health care provider that can promote health and wellness. Preventive care visits are also called wellness exams.  What can I expect for my preventive care visit?  Counseling  Your health care provider may ask you questions about your:  Medical history, including:  Past medical problems.  Family medical history.  Pregnancy history.  Current health, including:  Menstrual cycle.  Method of birth control.  Emotional well-being.  Home life and relationship well-being.  Sexual activity and sexual health.  Lifestyle, including:  Alcohol, nicotine or tobacco, and drug use.  Access to firearms.  Diet, exercise, and sleep habits.  Work and work Astronomer.  Sunscreen use.  Safety issues such as seatbelt and bike helmet use.  Physical exam  Your health care provider will check your:  Height and weight. These may be used to calculate your BMI (body mass index). BMI is a measurement that tells if you are at a healthy weight.  Waist circumference. This measures the distance around your waistline. This measurement also tells if you are at a healthy weight and may help predict your risk of certain diseases, such as type 2 diabetes and high blood pressure.  Heart rate and blood pressure.  Body temperature.  Skin for abnormal spots.  What immunizations do I need?    Vaccines are usually given at various ages, according to a schedule. Your health care provider will recommend vaccines for you based on your age, medical history, and lifestyle or other factors, such as travel or where you work.  What tests do I need?  Screening  Your health care provider may recommend screening tests for certain conditions. This may include:  Lipid and cholesterol levels.  Diabetes screening. This is done by checking your blood sugar (glucose) after you have not eaten for a while (fasting).  Pelvic exam and Pap test.  Hepatitis B test.  Hepatitis C  test.  HIV (human immunodeficiency virus) test.  STI (sexually transmitted infection) testing, if you are at risk.  Lung cancer screening.  Colorectal cancer screening.  Mammogram. Talk with your health care provider about when you should start having regular mammograms. This may depend on whether you have a family history of breast cancer.  BRCA-related cancer screening. This may be done if you have a family history of breast, ovarian, tubal, or peritoneal cancers.  Bone density scan. This is done to screen for osteoporosis.  Talk with your health care provider about your test results, treatment options, and if necessary, the need for more tests.  Follow these instructions at home:  Eating and drinking    Eat a diet that includes fresh fruits and vegetables, whole grains, lean protein, and low-fat dairy products.  Take vitamin and mineral supplements as recommended by your health care provider.  Do not drink alcohol if:  Your health care provider tells you not to drink.  You are pregnant, may be pregnant, or are planning to become pregnant.  If you drink alcohol:  Limit how much you have to 0-1 drink a day.  Know how much alcohol is in your drink. In the [place] one drink equals one 12 oz bottle of beer (355 mL), one 5 oz glass of wine (148 mL), or one 1 oz glass of hard liquor (44 mL).  Lifestyle  Brush your teeth every morning and night with fluoride toothpaste. Floss one time each day.  Exercise for at least  30 minutes 5 or more days each week.  Do not use any products that contain nicotine or tobacco. These products include cigarettes, chewing tobacco, and vaping devices, such as e-cigarettes. If you need help quitting, ask your health care provider.  Do not use drugs.  If you are sexually active, practice safe sex. Use a condom or other form of protection to prevent STIs.  If you do not wish to become pregnant, use a form of birth control. If you plan to become pregnant, see your health care provider for a  prepregnancy visit.  Take aspirin only as told by your health care provider. Make sure that you understand how much to take and what form to take. Work with your health care provider to find out whether it is safe and beneficial for you to take aspirin daily.  Find healthy ways to manage stress, such as:  Meditation, yoga, or listening to music.  Journaling.  Talking to a trusted person.  Spending time with friends and family.  Minimize exposure to UV radiation to reduce your risk of skin cancer.  Safety  Always wear your seat belt while driving or riding in a vehicle.  Do not drive:  If you have been drinking alcohol. Do not ride with someone who has been drinking.  When you are tired or distracted.  While texting.  If you have been using any mind-altering substances or drugs.  Wear a helmet and other protective equipment during sports activities.  If you have firearms in your house, make sure you follow all gun safety procedures.  Seek help if you have been physically or sexually abused.  What's next?  Visit your health care provider once a year for an annual wellness visit.  Ask your health care provider how often you should have your eyes and teeth checked.  Stay up to date on all vaccines.  This information is not intended to replace advice given to you by your health care provider. Make sure you discuss any questions you have with your health care provider.  Document Revised: 10/30/2020 Document Reviewed: 10/30/2020  Elsevier Patient Education  2024 [employer].

## 2024-02-22 ENCOUNTER — Encounter: Payer: Self-pay | Admitting: Family Medicine

## 2024-02-22 ENCOUNTER — Ambulatory Visit: Admitting: Family Medicine

## 2024-02-22 VITALS — BP 112/72 | HR 82 | Resp 16 | Ht 65.75 in | Wt 145.6 lb

## 2024-02-22 DIAGNOSIS — Z0001 Encounter for general adult medical examination with abnormal findings: Secondary | ICD-10-CM

## 2024-02-22 DIAGNOSIS — Z0000 Encounter for general adult medical examination without abnormal findings: Secondary | ICD-10-CM

## 2024-02-22 DIAGNOSIS — Z23 Encounter for immunization: Secondary | ICD-10-CM | POA: Diagnosis not present

## 2024-02-22 DIAGNOSIS — Z1211 Encounter for screening for malignant neoplasm of colon: Secondary | ICD-10-CM

## 2024-02-22 DIAGNOSIS — E611 Iron deficiency: Secondary | ICD-10-CM

## 2024-02-22 DIAGNOSIS — Z79899 Other long term (current) drug therapy: Secondary | ICD-10-CM | POA: Diagnosis not present

## 2024-02-22 DIAGNOSIS — Z131 Encounter for screening for diabetes mellitus: Secondary | ICD-10-CM

## 2024-02-22 DIAGNOSIS — Z13220 Encounter for screening for lipoid disorders: Secondary | ICD-10-CM

## 2024-02-22 NOTE — Progress Notes (Addendum)
 Name: Christine Ball   MRN: [id number]    DOB: 16-Jul-1980   Date:02/22/2024       Progress Note  Subjective  Chief Complaint  Chief Complaint  Patient presents with   Annual Exam    HPI  Patient presents for annual CPE.  Diet: balanced  Exercise: continue regular activity   Last Eye Exam: completed Last Dental Exam: completed  Flowsheet Row Office Visit from 02/22/2024 in [facility]  AUDIT-C Score 1    Depression: Phq 9 is  negative    02/22/2024    8:13 AM 01/08/2023    8:21 AM 01/07/2022    8:01 AM 05/20/2021    9:21 AM 03/11/2021    9:30 AM  Depression screen PHQ 2/9  Decreased Interest 0 0 0 0 0  Down, Depressed, Hopeless 0 1 0 1 1  PHQ - 2 Score 0 1 0 1 1  Altered sleeping 0 0 0 0 0  Tired, decreased energy 0 0 0 0 0  Change in appetite 0 0 0 0 0  Feeling bad or failure about yourself  0 0 0 1 1  Trouble concentrating 0 0 0 0 0  Moving slowly or fidgety/restless 0 0 0 0 0  Suicidal thoughts 0 0 0 0 0  PHQ-9 Score 0 1 0 2 2  Difficult doing work/chores Not difficult at all Not difficult at all  Not difficult at all Not difficult at all   Hypertension: BP Readings from Last 3 Encounters:  02/22/24 112/72  09/15/23 107/71  01/08/23 106/66   Obesity: Wt Readings from Last 3 Encounters:  02/22/24 145 lb 9.6 oz (66 kg)  09/15/23 145 lb 11.2 oz (66.1 kg)  01/08/23 143 lb 9.6 oz (65.1 kg)   BMI Readings from Last 3 Encounters:  02/22/24 23.68 kg/m  09/15/23 24.25 kg/m  01/08/23 23.53 kg/m     Vaccines: reviewed with the patient.   Hep C Screening: completed STD testing and prevention (HIV/chl/gon/syphilis): N/A Intimate partner violence: negative screen  Sexual History : no problems, one partner  Menstrual History/LMP/Abnormal Bleeding:  IUD, cycles every 3 weeks but light lasting only a few days  Discussed importance of follow up if any post-menopausal bleeding: not applicable  Incontinence Symptoms: positive for symptoms  stress incontinence   Breast cancer:  - Last Mammogram: due for mammogram  - BRCA gene screening: N/A  Osteoporosis Prevention : Discussed high calcium and vitamin D  supplementation, weight bearing exercises Bone density :not applicable   Cervical cancer screening: up-to-date  Skin cancer: Discussed monitoring for atypical lesions . Sees Dermatologist  Colorectal cancer: due in Feb    Lung cancer:  Low Dose CT Chest recommended if Age 85-80 years, 20 pack-year currently smoking OR have quit w/in 15years. Patient does not qualify for screen   ECG: N/A  Advanced Care Planning: A voluntary discussion about advance care planning including the explanation and discussion of advance directives.  Discussed health care proxy and Living will, and the patient was able to identify a health care proxy as husband .  Patient does have a living will and power of attorney of health care   Patient Active Problem List   Diagnosis Date Noted   Greater trochanteric pain syndrome of right lower extremity 03/11/2021   Strain of gastrocnemius muscle of left lower extremity 03/11/2021   Insomnia, persistent 03/14/2015   GAD (generalized anxiety disorder) 03/14/2015   Dermatitis, eczematoid 03/14/2015   Nonallopathic lesion of lumbosacral region  10/08/2014   Gluteal tendinitis of right buttock 08/21/2014    Past Surgical History:  Procedure Laterality Date   EYE SURGERY     lasik   LASIK Bilateral 10/23/2021   WISDOM TOOTH EXTRACTION      Family History  Problem Relation Age of Onset   Osteoporosis Mother    Breast cancer Mother 59   Other Father    Metabolic syndrome Father    Other Paternal Grandmother        Polio   Cancer Paternal Grandfather     Social History   Socioeconomic History   Marital status: Married    Spouse name: Adilenne Ashworth   Number of children: 2   Years of education: 16   Highest education level: Bachelor's degree (e.g., BA, AB, BS)  Occupational History    Occupation: stay at home mother   Tobacco Use   Smoking status: Never   Smokeless tobacco: Never  Vaping Use   Vaping status: Never Used  Substance and Sexual Activity   Alcohol use: Yes    Alcohol/week: 1.0 standard drink of alcohol    Types: 1 Standard drinks or equivalent per week   Drug use: Never   Sexual activity: Yes    Partners: Male    Birth control/protection: Surgical, None  Other Topics Concern   Not on file  Social History Narrative   Daughter had blebs and pneumothorax twice in the Spring of 2022   Social Drivers of Health   Financial Resource Strain: Low Risk  (02/22/2024)   Overall Financial Resource Strain (CARDIA)    Difficulty of Paying Living Expenses: Not hard at all  Food Insecurity: No Food Insecurity (02/22/2024)   Hunger Vital Sign    Worried About Running Out of Food in the Last Year: Never true    Ran Out of Food in the Last Year: Never true  Transportation Needs: No Transportation Needs (02/22/2024)   PRAPARE - Administrator, Civil Service (Medical): No    Lack of Transportation (Non-Medical): No  Physical Activity: Sufficiently Active (02/22/2024)   Exercise Vital Sign    Days of Exercise per Week: 5 days    Minutes of Exercise per Session: 30 min  Stress: No Stress Concern Present (02/22/2024)   [employer] of Occupational Health - Occupational Stress Questionnaire    Feeling of Stress: Only a little  Social Connections: Socially Integrated (02/22/2024)   Social Connection and Isolation Panel    Frequency of Communication with Friends and Family: More than three times a week    Frequency of Social Gatherings with Friends and Family: Twice a week    Attends Religious Services: More than 4 times per year    Active Member of [employer] or Organizations: Yes    Attends Engineer, structural: More than 4 times per year    Marital Status: Married  Catering manager Violence: Not At Risk (02/22/2024)   Humiliation, Afraid, Rape, and  Kick questionnaire    Fear of Current or Ex-Partner: No    Emotionally Abused: No    Physically Abused: No    Sexually Abused: No     Current Outpatient Medications:    cholecalciferol (VITAMIN D3) 25 MCG (1000 UNIT) tablet, Take 1,000 Units by mouth daily., Disp: , Rfl:    doxylamine, Sleep, (UNISOM) 25 MG tablet, Take 12.5 mg by mouth every evening., Disp: , Rfl:    Ferrous Sulfate (IRON PO), Take 1 tablet by mouth as needed., Disp: , Rfl:  levonorgestrel  (MIRENA , 52 MG,) 20 MCG/DAY IUD, 1 each by Intrauterine route once. INSERTED 07/02/21, Disp: , Rfl:    Magnesium 200 MG TABS, Take 200 mg by mouth at bedtime., Disp: , Rfl:    Multiple Vitamin (MULTIVITAMIN) tablet, Take 1 tablet by mouth daily., Disp: , Rfl:    Omega-3 Fatty Acids (FISH OIL) 1000 MG CAPS, Take 2 each by mouth daily., Disp: , Rfl:   Allergies  Allergen Reactions   Penicillins Hives   Sulfa  Antibiotics Hives     ROS  Constitutional: Negative for fever or weight change.  Respiratory: Negative for cough and shortness of breath.   Cardiovascular: Negative for chest pain or palpitations.  Gastrointestinal: Negative for abdominal pain, no bowel changes.  Musculoskeletal: Negative for gait problem or joint swelling.  Skin: Negative for rash.  Neurological: Negative for dizziness or headache.  No other specific complaints in a complete review of systems (except as listed in HPI above).   Objective  Vitals:   02/22/24 0817  BP: 112/72  Pulse: 82  Resp: 16  SpO2: 99%  Weight: 145 lb 9.6 oz (66 kg)  Height: 5' 5.75 (1.67 m)    Body mass index is 23.68 kg/m.  Physical Exam  Constitutional: Patient appears well-developed and well-nourished. No distress.  HENT: Head: Normocephalic and atraumatic. Ears: B TMs ok, no erythema or effusion; Nose: Nose normal. Mouth/Throat: Oropharynx is clear and moist. No oropharyngeal exudate.  Eyes: Conjunctivae and EOM are normal. Pupils are equal, round, and reactive to  light. No scleral icterus.  Neck: Normal range of motion. Neck supple. No JVD present. No thyromegaly present.  Cardiovascular: Normal rate, regular rhythm and normal heart sounds.  No murmur heard. No BLE edema. Pulmonary/Chest: Effort normal and breath sounds normal. No respiratory distress. Abdominal: Soft. Bowel sounds are normal, no distension. There is no tenderness. no masses Breast: no lumps or masses, no nipple discharge or rashes FEMALE GENITALIA:  Not done RECTAL: not done  Musculoskeletal: Normal range of motion, no joint effusions. No gross deformities Neurological: he is alert and oriented to person, place, and time. No cranial nerve deficit. Coordination, balance, strength, speech and gait are normal.  Skin: Skin is warm and dry. No rash noted. No erythema. Dark spot on right outer hip, seems to be vascular, she has an upcoming visit with Dermatologist in a couple of months Psychiatric: Patient has a normal mood and affect. behavior is normal. Judgment and thought content normal.    Assessment & Plan  1. Well adult exam (Primary)  - CBC with Differential/Platelet - Comprehensive metabolic panel with GFR - Lipid panel - Hemoglobin A1c - VITAMIN D  25 Hydroxy (Vit-D Deficiency, Fractures) - B12 and Folate Panel - Iron, TIBC and Ferritin Panel  2. Need for influenza vaccination  - Flu vaccine trivalent PF, 6mos and older(Flulaval ,Afluria,Fluarix,Fluzone)  3. Diabetes mellitus screening  - Hemoglobin A1c  4. Long-term use of high-risk medication  - CBC with Differential/Platelet - Comprehensive metabolic panel with GFR  5. Lipid screening  - Lipid panel  6. Iron deficiency  - B12 and Folate Panel - Iron, TIBC and Ferritin Panel  7. Colon cancer screening  - Ambulatory referral to Gastroenterology   -USPSTF grade A and B recommendations reviewed with patient; age-appropriate recommendations, preventive care, screening tests, etc discussed and encouraged;  healthy living encouraged; see AVS for patient education given to patient -Discussed importance of 150 minutes of physical activity weekly, eat two servings of fish weekly, eat one serving of  tree nuts ( cashews, pistachios, pecans, almonds.SABRA) every other day, eat 6 servings of fruit/vegetables daily and drink plenty of water and avoid sweet beverages.   -Reviewed Health Maintenance: Yes.

## 2024-02-29 ENCOUNTER — Ambulatory Visit: Payer: Self-pay | Admitting: Family Medicine

## 2024-02-29 LAB — IRON,TIBC AND FERRITIN PANEL
%SAT: 35 % (ref 16–45)
Ferritin: 24 ng/mL (ref 16–232)
Iron: 112 ug/dL (ref 40–232)
TIBC: 317 ug/dL (ref 250–450)

## 2024-02-29 LAB — COMPREHENSIVE METABOLIC PANEL WITH GFR
AG Ratio: 1.9 (calc) (ref 1.0–2.5)
ALT: 22 U/L (ref 6–29)
AST: 24 U/L (ref 10–30)
Albumin: 4.5 g/dL (ref 3.6–5.1)
Alkaline phosphatase (APISO): 59 U/L (ref 31–125)
BUN: 16 mg/dL (ref 7–25)
CO2: 29 mmol/L (ref 20–32)
Calcium: 9.5 mg/dL (ref 8.6–10.2)
Chloride: 104 mmol/L (ref 98–110)
Creat: 0.95 mg/dL (ref 0.50–0.99)
Globulin: 2.4 g/dL (ref 1.9–3.7)
Glucose, Bld: 90 mg/dL (ref 65–99)
Potassium: 5.4 mmol/L — ABNORMAL HIGH (ref 3.5–5.3)
Sodium: 138 mmol/L (ref 135–146)
Total Bilirubin: 0.5 mg/dL (ref 0.2–1.2)
Total Protein: 6.9 g/dL (ref 6.1–8.1)
eGFR: 76 mL/min/1.73m2 (ref >=60)

## 2024-02-29 LAB — CBC WITH DIFFERENTIAL/PLATELET
Absolute Lymphocytes: 1763 {cells}/uL (ref 850–3900)
Absolute Monocytes: 601 {cells}/uL (ref 200–950)
Basophils Absolute: 31 {cells}/uL (ref 0–200)
Basophils Relative: 0.4 %
Eosinophils Absolute: 226 {cells}/uL (ref 15–500)
Eosinophils Relative: 2.9 %
HCT: 46.2 % — ABNORMAL HIGH (ref 35.0–45.0)
Hemoglobin: 15.2 g/dL (ref 11.7–15.5)
MCH: 31.1 pg (ref 27.0–33.0)
MCHC: 32.9 g/dL (ref 32.0–36.0)
MCV: 94.7 fL (ref 80.0–100.0)
MPV: 10.4 fL (ref 7.5–12.5)
Monocytes Relative: 7.7 %
Neutro Abs: 5179 {cells}/uL (ref 1500–7800)
Neutrophils Relative %: 66.4 %
Platelets: 274 Thousand/uL (ref 140–400)
RBC: 4.88 Million/uL (ref 3.80–5.10)
RDW: 12.1 % (ref 11.0–15.0)
Total Lymphocyte: 22.6 %
WBC: 7.8 Thousand/uL (ref 3.8–10.8)

## 2024-02-29 LAB — LIPID PANEL
Cholesterol: 188 mg/dL (ref <200)
HDL: 53 mg/dL (ref >=50)
LDL Cholesterol (Calc): 116 mg/dL — ABNORMAL HIGH
Non-HDL Cholesterol (Calc): 135 mg/dL — ABNORMAL HIGH (ref <130)
Total CHOL/HDL Ratio: 3.5 (calc) (ref <5.0)
Triglycerides: 88 mg/dL (ref <150)

## 2024-02-29 LAB — B12 AND FOLATE PANEL
Folate: 14.7 ng/mL
Vitamin B-12: 425 pg/mL (ref 200–1100)

## 2024-02-29 LAB — HEMOGLOBIN A1C
Hgb A1c MFr Bld: 5.1 % (ref <5.7)
Mean Plasma Glucose: 100 mg/dL
eAG (mmol/L): 5.5 mmol/L

## 2024-02-29 LAB — VITAMIN D 25 HYDROXY (VIT D DEFICIENCY, FRACTURES): Vit D, 25-Hydroxy: 57 ng/mL (ref 30–100)

## 2024-03-09 ENCOUNTER — Telehealth: Payer: Self-pay

## 2024-03-09 ENCOUNTER — Other Ambulatory Visit: Payer: Self-pay

## 2024-03-09 DIAGNOSIS — Z1211 Encounter for screening for malignant neoplasm of colon: Secondary | ICD-10-CM

## 2024-03-09 DIAGNOSIS — Z83719 Family history of colon polyps, unspecified: Secondary | ICD-10-CM

## 2024-03-09 MED ORDER — SUTAB 1479-225-188 MG PO TABS
12.0000 | ORAL_TABLET | Freq: Two times a day (BID) | ORAL | 0 refills | Status: AC
Start: 2024-03-09 — End: 2024-03-10
  Filled 2024-03-09: qty 24, 1d supply, fill #0

## 2024-03-09 NOTE — Telephone Encounter (Signed)
 Gastroenterology Pre-Procedure Review  Request Date: 07/12/24 Requesting Physician: Dr. Farris  PATIENT REVIEW QUESTIONS: The patient responded to the following health history questions as indicated:    1. Are you having any GI issues? no 2. Do you have a personal history of Polyps? no 3. Do you have a family history of Colon Cancer or Polyps? yes (mother colon polyps) 4. Diabetes Mellitus? no 5. Joint replacements in the past 12 months?no 6. Major health problems in the past 3 months?no 7. Any artificial heart valves, MVP, or defibrillator?no    MEDICATIONS & ALLERGIES:    Patient reports the following regarding taking any anticoagulation/antiplatelet therapy:   Plavix, Coumadin, Eliquis, Xarelto, Lovenox, Pradaxa, Brilinta, or Effient? no Aspirin? no  Patient confirms/reports the following medications:  Current Outpatient Medications  Medication Sig Dispense Refill   cholecalciferol (VITAMIN D3) 25 MCG (1000 UNIT) tablet Take 1,000 Units by mouth daily.     doxylamine, Sleep, (UNISOM) 25 MG tablet Take 12.5 mg by mouth every evening.     Ferrous Sulfate (IRON PO) Take 1 tablet by mouth as needed.     levonorgestrel  (MIRENA , 52 MG,) 20 MCG/DAY IUD 1 each by Intrauterine route once. INSERTED 07/02/21     Magnesium 200 MG TABS Take 200 mg by mouth at bedtime.     Multiple Vitamin (MULTIVITAMIN) tablet Take 1 tablet by mouth daily.     Omega-3 Fatty Acids (FISH OIL) 1000 MG CAPS Take 2 each by mouth daily.     No current facility-administered medications for this visit.    Patient confirms/reports the following allergies:  Allergies  Allergen Reactions   Penicillins Hives   Sulfa  Antibiotics Hives    No orders of the defined types were placed in this encounter.   AUTHORIZATION INFORMATION Primary Insurance: 1D#: Group #:  Secondary Insurance: 1D#: Group #:  SCHEDULE INFORMATION: Date: 06/26/24 Time: Location: [facility]

## 2024-03-21 ENCOUNTER — Other Ambulatory Visit: Payer: Self-pay

## 2024-03-22 ENCOUNTER — Ambulatory Visit
Admission: RE | Admit: 2024-03-22 | Discharge: 2024-03-22 | Disposition: A | Discharge status: Home / Self Care | Source: Ambulatory Visit | Attending: Certified Nurse Midwife | Admitting: Certified Nurse Midwife

## 2024-03-22 DIAGNOSIS — L578 Other skin changes due to chronic exposure to nonionizing radiation: Secondary | ICD-10-CM | POA: Diagnosis not present

## 2024-03-22 DIAGNOSIS — Z1231 Encounter for screening mammogram for malignant neoplasm of breast: Secondary | ICD-10-CM | POA: Diagnosis not present

## 2024-03-22 DIAGNOSIS — Z86018 Personal history of other benign neoplasm: Secondary | ICD-10-CM | POA: Diagnosis not present

## 2024-03-22 DIAGNOSIS — Z808 Family history of malignant neoplasm of other organs or systems: Secondary | ICD-10-CM | POA: Diagnosis not present

## 2024-03-22 DIAGNOSIS — D239 Other benign neoplasm of skin, unspecified: Secondary | ICD-10-CM | POA: Diagnosis not present

## 2024-03-22 DIAGNOSIS — Z01419 Encounter for gynecological examination (general) (routine) without abnormal findings: Secondary | ICD-10-CM

## 2024-06-08 ENCOUNTER — Other Ambulatory Visit: Payer: Self-pay

## 2024-06-08 MED ORDER — NA SULFATE-K SULFATE-MG SULF 17.5-3.13-1.6 GM/177ML PO SOLN
354.0000 mL | Freq: Once | ORAL | 0 refills | Status: AC
  Filled 2024-06-08: qty 354, 1d supply, fill #0

## 2024-06-08 NOTE — Addendum Note (Signed)
 Addended by: JODIE HEADINGS on: 06/08/2024 11:11 AM   Modules accepted: Orders

## 2024-06-09 ENCOUNTER — Other Ambulatory Visit: Payer: Self-pay

## 2024-07-12 ENCOUNTER — Ambulatory Visit: Admit: 2024-07-12 | Admitting: Gastroenterology

## 2024-07-12 SURGERY — COLONOSCOPY
Anesthesia: Choice

## 2025-02-22 ENCOUNTER — Encounter: Admitting: Family Medicine
